# Patient Record
Sex: Female | Born: 1965 | Race: White | Hispanic: No | Marital: Married | Smoking: Former smoker
Health system: Southern US, Community
[De-identification: ages and names within clinical notes are randomized; demographics above are authoritative.]

## PROBLEM LIST (undated history)

## (undated) DIAGNOSIS — T7840XA Allergy, unspecified, initial encounter: Secondary | ICD-10-CM

## (undated) DIAGNOSIS — Z9109 Other allergy status, other than to drugs and biological substances: Secondary | ICD-10-CM

## (undated) DIAGNOSIS — N83209 Unspecified ovarian cyst, unspecified side: Secondary | ICD-10-CM

## (undated) DIAGNOSIS — Z8489 Family history of other specified conditions: Secondary | ICD-10-CM

## (undated) DIAGNOSIS — N946 Dysmenorrhea, unspecified: Secondary | ICD-10-CM

## (undated) DIAGNOSIS — D219 Benign neoplasm of connective and other soft tissue, unspecified: Secondary | ICD-10-CM

## (undated) HISTORY — PX: DILATION AND CURETTAGE OF UTERUS: SHX78

## (undated) HISTORY — DX: Allergy, unspecified, initial encounter: T78.40XA

## (undated) HISTORY — DX: Dysmenorrhea, unspecified: N94.6

## (undated) HISTORY — PX: TUBAL LIGATION: SHX77

## (undated) HISTORY — PX: ENDOMETRIAL ABLATION: SHX621

## (undated) HISTORY — DX: Benign neoplasm of connective and other soft tissue, unspecified: D21.9

## (undated) HISTORY — DX: Unspecified ovarian cyst, unspecified side: N83.209

---

## 2000-10-18 ENCOUNTER — Other Ambulatory Visit: Admission: RE | Admit: 2000-10-18 | Discharge: 2000-10-18 | Payer: Self-pay | Admitting: Obstetrics and Gynecology

## 2004-02-13 ENCOUNTER — Ambulatory Visit (HOSPITAL_COMMUNITY): Admission: RE | Admit: 2004-02-13 | Discharge: 2004-02-13 | Payer: Self-pay | Admitting: Obstetrics and Gynecology

## 2004-02-14 ENCOUNTER — Ambulatory Visit (HOSPITAL_COMMUNITY): Admission: RE | Admit: 2004-02-14 | Discharge: 2004-02-14 | Payer: Self-pay | Admitting: Obstetrics and Gynecology

## 2004-11-21 ENCOUNTER — Ambulatory Visit (HOSPITAL_COMMUNITY): Admission: RE | Admit: 2004-11-21 | Discharge: 2004-11-21 | Payer: Self-pay | Admitting: Family Medicine

## 2006-11-15 ENCOUNTER — Other Ambulatory Visit: Admission: RE | Admit: 2006-11-15 | Discharge: 2006-11-15 | Payer: Self-pay | Admitting: Internal Medicine

## 2006-11-16 ENCOUNTER — Ambulatory Visit (HOSPITAL_COMMUNITY): Admission: RE | Admit: 2006-11-16 | Discharge: 2006-11-16 | Payer: Self-pay | Admitting: Obstetrics & Gynecology

## 2007-04-21 ENCOUNTER — Ambulatory Visit (HOSPITAL_COMMUNITY): Admission: RE | Admit: 2007-04-21 | Discharge: 2007-04-21 | Payer: Self-pay | Admitting: Obstetrics and Gynecology

## 2010-02-13 ENCOUNTER — Other Ambulatory Visit: Payer: Self-pay | Admitting: Obstetrics & Gynecology

## 2010-02-13 ENCOUNTER — Other Ambulatory Visit: Payer: Self-pay | Admitting: Adult Health

## 2010-02-13 ENCOUNTER — Other Ambulatory Visit (HOSPITAL_COMMUNITY)
Admission: RE | Admit: 2010-02-13 | Discharge: 2010-02-13 | Disposition: A | Discharge status: Home / Self Care | Payer: 59 | Source: Ambulatory Visit | Attending: Obstetrics and Gynecology | Admitting: Obstetrics and Gynecology

## 2010-02-13 DIAGNOSIS — Z113 Encounter for screening for infections with a predominantly sexual mode of transmission: Secondary | ICD-10-CM | POA: Insufficient documentation

## 2010-02-13 DIAGNOSIS — N946 Dysmenorrhea, unspecified: Secondary | ICD-10-CM

## 2010-02-13 DIAGNOSIS — Z01419 Encounter for gynecological examination (general) (routine) without abnormal findings: Secondary | ICD-10-CM | POA: Insufficient documentation

## 2010-02-14 ENCOUNTER — Ambulatory Visit (HOSPITAL_COMMUNITY)
Admission: RE | Admit: 2010-02-14 | Discharge: 2010-02-14 | Disposition: A | Discharge status: Home / Self Care | Payer: 59 | Source: Ambulatory Visit | Attending: Obstetrics & Gynecology | Admitting: Obstetrics & Gynecology

## 2010-02-14 DIAGNOSIS — N946 Dysmenorrhea, unspecified: Secondary | ICD-10-CM | POA: Insufficient documentation

## 2010-02-14 DIAGNOSIS — D259 Leiomyoma of uterus, unspecified: Secondary | ICD-10-CM | POA: Insufficient documentation

## 2010-05-20 NOTE — H&P (Signed)
Tammy Garner, Tammy Garner          ACCOUNT NO.:  [id number]   MEDICAL RECORD NO.:  [id number]           PATIENT TYPE:  AMB   LOCATION:  DAY                           FACILITY:  [facility]   PHYSICIAN:  Tilda Burrow, M.D. DATE OF BIRTH:  1965-09-25   DATE OF ADMISSION:  DATE OF DISCHARGE:  LH                              HISTORY & PHYSICAL   ADMISSION DIAGNOSES:  1. Menorrhagia.  2. Dysmenorrhea.  3. Elective sterilization.   This 45 year old female, LMP was March 21, 2007, is admitted to [facility] for laparoscopic tubal sterilization by  Falope rings and hysteroscopy D&C endometrial ablation.   Tammy Garner has been seen in our office for problems of heavy periods since  her November annual exam.  She complained of dysmenorrhea.  She does not  have any pain except with the passage of the heavy clots.  She flows for  7 days or more at this time.  She is not anemic.  The procedure has been  reviewed at length with hysteroscopy D&C and endometrial ablation,  discussed with the patient including viewing of the Gynecare endometrial  ablation videotapes and use of the brochures for further clarification.  The patient understands the procedure to her satisfaction.  A 90%  success rate has been quoted to the patient.   PAST MEDICAL HISTORY:  Benign.   SURGICAL HISTORY:  Primary C-section for first baby.   REVIEW OF SYSTEMS:  Negative for bladder problems.  Normal bowel  function is reported.  There is no dyspareunia.  She denies reflux  symptoms, nausea, vomiting, or diarrhea.  She has no inner menstrual  bleeding.  There are no known allergies.  GENERAL PHYSICAL EXAM:  Reveals a healthy, pleasant Caucasian female  alert, oriented x3.  Pupils equal, round, and reactive.  Extraocular  movements intact.  NECK:  Supple.  Trachea midline.  CHEST:  Clear to auscultation.  ABDOMEN:  Moderate obesity without masses.  EXTERNAL GENITALIA:  Normal.  CERVIX:  Small,  smooth, nontender, recent Pap smear class I.   MEDICATIONS:  She takes garlic.  She takes colostrum.   HABITS:  Cigarettes five per day.  Alcohol rarely consumes single wine.  Recreational drugs denied.   IMPRESSION:  1. Menorrhagia.  2. Dysmenorrhea.  3. Elective sterilization.   PLAN:  Tubal ligation, Falope rings, followed by endometrial ablation on  April 21, 2007 at [facility].      Tilda Burrow, M.D.  Electronically Signed     JVF/MEDQ  D:  04/06/2007  T:  04/06/2007  Job:  [id number]   cc:   [facility]  Fax: [phone]

## 2010-05-20 NOTE — H&P (Signed)
NAMELETA, BUCKLIN          ACCOUNT NO.:  [id number]   MEDICAL RECORD NO.:  [id number]           PATIENT TYPE:  AMB   LOCATION:  DAY                           FACILITY:  [facility]   PHYSICIAN:  Tilda Burrow, M.D. DATE OF BIRTH:  07-21-1965   DATE OF ADMISSION:  DATE OF DISCHARGE:  LH                              HISTORY & PHYSICAL   ADMISSION DIAGNOSES:  1. Menorrhagia.  2. Dysmenorrhea.  3. Elective sterilization.   This 45 year old female, LMP was March 21, 2007, is admitted to [facility] for laparoscopic tubal sterilization by  Falope rings and hysteroscopy D&C endometrial ablation.   Rashay has been seen in our office for problems of heavy periods since  her November annual exam.  She complained of dysmenorrhea.  She does not  have any pain except with the passage of the heavy clots.  She flows for  7 days or more at this time.  She is not anemic.  The procedure has been  reviewed at length with hysteroscopy D&C and endometrial ablation,  discussed with the patient including viewing of the Gynecare endometrial  ablation videotapes and use of the brochures for further clarification.  The patient understands the procedure to her satisfaction.  A 90%  success rate has been quoted to the patient.   PAST MEDICAL HISTORY:  Benign.   SURGICAL HISTORY:  Primary C-section for first baby.   REVIEW OF SYSTEMS:  Negative for bladder problems.  Normal bowel  function is reported.  There is no dyspareunia.  She denies reflux  symptoms, nausea, vomiting, or diarrhea.  She has no inner menstrual  bleeding.  There are no known allergies.  GENERAL PHYSICAL EXAM:  Reveals a healthy, pleasant Caucasian female  alert, oriented x3.  Pupils equal, round, and reactive.  Extraocular  movements intact.  NECK:  Supple.  Trachea midline.  CHEST:  Clear to auscultation.  ABDOMEN:  Moderate obesity without masses.  EXTERNAL GENITALIA:  Normal.  CERVIX:  Small,  smooth, nontender, recent Pap smear class I.   MEDICATIONS:  She takes garlic.  She takes colostrum.   HABITS:  Cigarettes five per day.  Alcohol rarely consumes single wine.  Recreational drugs denied.   IMPRESSION:  1. Menorrhagia.  2. Dysmenorrhea.  3. Elective sterilization.   PLAN:  Tubal ligation, Falope rings, followed by endometrial ablation on  April 21, 2007 at [facility].      Tilda Burrow, M.D.  Electronically Signed     JVF/MEDQ  D:  04/06/2007  T:  04/06/2007  Job:  [id number]   cc:   [facility]  Fax: [phone]

## 2010-05-20 NOTE — Op Note (Signed)
Tammy Garner, Tammy Garner          ACCOUNT NO.:  [id number]   MEDICAL RECORD NO.:  [id number]          PATIENT TYPE:  AMB   LOCATION:  DAY                           FACILITY:  [facility]   PHYSICIAN:  Tilda Burrow, M.D. DATE OF BIRTH:  Aug 31, 1965   DATE OF PROCEDURE:  DATE OF DISCHARGE:                               OPERATIVE REPORT   PREOPERATIVE DIAGNOSIS:  1. Sterilization.  2. Menorrhagia, requesting endometrial ablation.   PROCEDURE:  Laparoscopic tubal sterilization with Falope ring,  hysteroscopy, D&C, and endometrial ablation.   SURGEON:  Tilda Burrow, M.D.   ASSISTANTBirdie Riddle, R.N.   ANESTHESIA:  Rosezetta Schlatter, CRNA.   COMPLICATIONS:  None.   FINDINGS:  Normal-appearing uterine contour, normal fallopian tubes,  thin endometrial cavity without visible tissue abnormalities, no  significant amount of tissue present as the patient is immediately after  her menses.   DETAILS OF PROCEDURE:  The patient was taken to the operating room,  prepped and draped for laparoscopic tubal sterilization with Hulka  tenaculum attached to the cervix for uterine manipulation.  An  infraumbilical vertical 1-cm skin incision was made as well as a  transverse suprapubic incision with Veress needle used through the  umbilicus to enter the peritoneal cavity where water droplet test was  used to confirm the intraperitoneal location and then the  pneumoperitoneum achieved to 3 L CO2 under 10 mm of mercury pressure.  Laparoscopic 5-mm trocar was introduced under direct visualization  without difficulties or complications.  Pelvis inspected; no evidence of  bleeding or injury to the internal organs suspected.  Suprapubic trocar,  8-mm diameter, was introduced under direct visualization without  difficulty.  Attention was directed to the left fallopian tube which was  identified, elevated, and a Falope ring applied to the midportion of the  left tube.  Photo documentation was  performed.  Similar procedure was  performed on the right side.  Both tubes were infiltrated with Marcaine  solution both under and above the Falope ring for pain control.  Deflation of the abdomen was performed and 120 mL saline placed in the  abdomen to help with the evacuation of carbon dioxide from the abdominal  cavity.  Steri-Strips were placed on the skin after subcuticular 4-0  Dexon used to close the skin incisions.   The hysteroscopy, D&C, endometrial ablation.  This procedure was  performed by positioning the surgeon at the end of the exam table with  the patient's legs in low lithotomy support position.  The cervix was  grasped with single-tooth tenaculum dilated to 25-French allowing  introduction of the rigid 30-degree hysteroscope into the uterine  cavity.  Photos of the lower uterine segment were taken as well as photo  documentation of the tubal ostia on the left and right.  The endometrial  cavity was very thin and denuded as the patient was immediately post  completion of menses.  So, a smooth sharp curettage obtained essentially  no tissue.  Therefore, there was no specimen.  Endometrial ablation  sequence was then conducted in a standard fashion using Gynecare  Thermachoice 3 endometrial ablation device at 87-degree  centigrade  thermal ablation.  Saline, 9 mL, was used and all 9 mL were recovered at  the end of the 8-minute sequence of ablation.  Paracervical block with  12 mL of Marcaine solution was performed.  The patient tolerated the  procedure well and went to the recovery room in good condition.      Tilda Burrow, M.D.  Electronically Signed     JVF/MEDQ  D:  04/21/2007  T:  04/21/2007  Job:  [id number]

## 2010-05-23 NOTE — Op Note (Signed)
NAMEMODESTY, RUDY          ACCOUNT NO.:  [id number]   MEDICAL RECORD NO.:  [id number]          PATIENT TYPE:  AMB   LOCATION:  DAY                           FACILITY:  [facility]   PHYSICIAN:  Tilda Burrow, M.D. DATE OF BIRTH:  1965/02/20   DATE OF PROCEDURE:  DATE OF DISCHARGE:                                 OPERATIVE REPORT   PREOPERATIVE DIAGNOSIS:  Missed abortion.   POSTOPERATIVE DIAGNOSIS:  Missed abortion.   PROCEDURE:  Suction dilation and curettage.   SURGEON:  Tilda Burrow, M.D.   ASSISTANT:  None.   FINDINGS:  Normal feeling internal uterine cavity.  Elongate, narrow uterus  13 cm on sounding. No identifiable fibroid deformity of the inside walls of  the uterine cavity.   DETAILS OF PROCEDURE:  The patient was taken to the operating room,  whereupon prepping and draping of the perineum was performed.  Blood type is  confirmed in the old records; it was A positive with the hemoglobin 13 and  hematocrit 41.  The patient had the cervix grasped with a single toothed  tenaculum dilated to 25 [place] with uterine sounding to 13 cm.  The cervix  was dilated allowing introduction of a curved 8 mm suction curette which was  able to extract appropriate amounts of tissue and blood in fragments under  suction technique.  A smooth sharp curettage in all quadrants confirmed  satisfactory uterine evacuation.  There was never any suspicion of  perforation.  The patient, then, was awakened, went to recovery room in good  condition with sponge and needle counts correct.      JVF/MEDQ  D:  02/14/2004  T:  02/14/2004  Job:  [id number]   cc:   Tilda Burrow, M.D.  [address] [place]  [place] [address]  Fax: [phone]

## 2010-09-30 LAB — CBC
HCT: 39.3
Hemoglobin: 13.9
MCHC: 35.4
MCV: 93.7
Platelets: 262
RBC: 4.2
RDW: 12.6
WBC: 10

## 2010-09-30 LAB — HCG, QUANTITATIVE, PREGNANCY: hCG, Beta Chain, Quant, S: <2

## 2010-12-09 ENCOUNTER — Other Ambulatory Visit: Payer: Self-pay

## 2010-12-09 ENCOUNTER — Emergency Department (HOSPITAL_COMMUNITY)
Admission: EM | Admit: 2010-12-09 | Discharge: 2010-12-09 | Disposition: A | Discharge status: Home / Self Care | Payer: 59 | Attending: Emergency Medicine | Admitting: Emergency Medicine

## 2010-12-09 ENCOUNTER — Emergency Department (HOSPITAL_COMMUNITY): Payer: 59

## 2010-12-09 ENCOUNTER — Encounter: Payer: Self-pay | Admitting: *Deleted

## 2010-12-09 DIAGNOSIS — R0609 Other forms of dyspnea: Secondary | ICD-10-CM | POA: Insufficient documentation

## 2010-12-09 DIAGNOSIS — R0602 Shortness of breath: Secondary | ICD-10-CM | POA: Insufficient documentation

## 2010-12-09 DIAGNOSIS — R0989 Other specified symptoms and signs involving the circulatory and respiratory systems: Secondary | ICD-10-CM | POA: Insufficient documentation

## 2010-12-09 DIAGNOSIS — R0600 Dyspnea, unspecified: Secondary | ICD-10-CM

## 2010-12-09 DIAGNOSIS — J45909 Unspecified asthma, uncomplicated: Secondary | ICD-10-CM | POA: Insufficient documentation

## 2010-12-09 DIAGNOSIS — R0789 Other chest pain: Secondary | ICD-10-CM | POA: Insufficient documentation

## 2010-12-09 DIAGNOSIS — F17200 Nicotine dependence, unspecified, uncomplicated: Secondary | ICD-10-CM | POA: Insufficient documentation

## 2010-12-09 HISTORY — DX: Other allergy status, other than to drugs and biological substances: Z91.09

## 2010-12-09 MED ORDER — PREDNISONE 20 MG PO TABS: 60.0000 mg | ORAL_TABLET | Freq: Every day | ORAL | Status: AC

## 2010-12-09 MED ORDER — PREDNISONE 20 MG PO TABS
60.0000 mg | ORAL_TABLET | Freq: Once | ORAL | Status: AC
  Administered 2010-12-09: 60 mg via ORAL
  Filled 2010-12-09: qty 3

## 2010-12-09 MED ORDER — ALBUTEROL SULFATE (5 MG/ML) 0.5% IN NEBU
5.0000 mg | INHALATION_SOLUTION | Freq: Once | RESPIRATORY_TRACT | Status: AC
  Administered 2010-12-09: 5 mg via RESPIRATORY_TRACT
  Filled 2010-12-09: qty 1

## 2010-12-09 NOTE — ED Notes (Signed)
Shortness of breath, congestion, ears popping.

## 2010-12-09 NOTE — ED Provider Notes (Signed)
History     CSN: [id number] Arrival date & time: 12/09/2010 12:24 AM   First MD Initiated Contact with Patient 12/09/10 0038      Chief Complaint  Patient presents with  . Shortness of Breath    (Consider location/radiation/quality/duration/timing/severity/associated sxs/prior treatment) HPI   Patient presents with 2 days of dyspnea and diffuse anteriorr hemothorax. She notes that in the days prior to development of her symptoms she was working in her yard, and in her usual state of health. Since onset her shortness of breath has been persistent, worsening over the past 12 hours. The pressure similarly has been constant, nonradiating, described as pressure like without pain. This sensation is not exertional, and there is no pleuritic pain at all. The patient's symptoms are not relieved with anything. They seem to be worsening on their own. No fevers, no vomiting, no diarrhea, mild cough. No sick contacts Past Medical History  Diagnosis Date  . Asthma   . Environmental allergies     Past Surgical History  Procedure Date  . Cesarean section   . Endometrial ablation   . Tubal ligation     History reviewed. No pertinent family history.  History  Substance Use Topics  . Smoking status: Current Everyday Smoker  . Smokeless tobacco: Not on file  . Alcohol Use: Yes    OB History    Grav Para Term Preterm Abortions TAB SAB Ect Mult Living                  Review of Systems  All other systems reviewed and are negative.    Allergies  Review of patient's allergies indicates no known allergies.  Home Medications   Current Outpatient Rx  Name Route Sig Dispense Refill  . ALBUTEROL SULFATE HFA 108 (90 BASE) MCG/ACT IN AERS Inhalation Inhale 2 puffs into the lungs every 6 (six) hours as needed.      Marland Kitchen CETIRIZINE HCL 10 MG PO TABS Oral Take 10 mg by mouth daily.      . GUAIFENESIN ER 600 MG PO TB12 Oral Take 1,200 mg by mouth 2 (two) times daily.      Marland Kitchen PROGESTERONE  MICRONIZED 100 MG PO CAPS Oral Take 100 mg by mouth daily. Unsure of doseage       BP 147/80  Pulse 89  Temp(Src) 98.5 F (36.9 C) (Oral)  Resp 20  SpO2 97%  LMP 12/02/2010  Physical Exam  Constitutional: She is oriented to person, place, and time. She appears well-developed and well-nourished.  HENT:  Head: Normocephalic and atraumatic.  Eyes: EOM are normal.  Cardiovascular: Normal rate and regular rhythm.   Pulmonary/Chest: Effort normal. She has wheezes in the right middle field.  Abdominal: She exhibits no distension.  Musculoskeletal: She exhibits no edema and no tenderness.  Neurological: She is alert and oriented to person, place, and time.  Skin: Skin is warm and dry.    ED Course  Procedures (including critical care time)  Labs Reviewed - No data to display No results found.   No diagnosis found.   X-ray does not demonstrate acute pathology   Date: 12/09/2010  Rate: 79  Rhythm: normal sinus rhythm  QRS Axis: normal  Intervals: normal  ST/T Wave abnormalities: normal  Conduction Disutrbances:none  Narrative Interpretation:   Old EKG Reviewed: none available   MDM  This 45 year old female presents with 2 days of dyspnea and mild chest pressure. He persistency of her pressure, the absence of cardiac history or risk  factors his reassuring. Similarly reassuring is the unremarkable ECG. The patient's presentation is more consistent with reactive airway disease, particularly with the wheezing that is audible on the patient's right lung field. The patient received an albuterol nebulizer, steroids and will be discharged with a short course of continued steroids and instructions to follow up with her primary care physician        Gerhard Munch, MD 12/09/10 [phone]

## 2010-12-09 NOTE — ED Notes (Signed)
Discharge instructions reviewed with pt, questions answered. Pt verbalized understanding.  

## 2013-09-22 ENCOUNTER — Ambulatory Visit (INDEPENDENT_AMBULATORY_CARE_PROVIDER_SITE_OTHER): Payer: 59 | Admitting: Family Medicine

## 2013-09-22 ENCOUNTER — Encounter: Payer: Self-pay | Admitting: Family Medicine

## 2013-09-22 VITALS — BP 120/88 | Temp 98.3°F | Ht 69.0 in | Wt 192.5 lb

## 2013-09-22 DIAGNOSIS — R109 Unspecified abdominal pain: Secondary | ICD-10-CM

## 2013-09-22 DIAGNOSIS — N3090 Cystitis, unspecified without hematuria: Secondary | ICD-10-CM

## 2013-09-22 LAB — POCT URINALYSIS DIPSTICK
Spec Grav, UA: <=1.005
pH, UA: 7

## 2013-09-22 MED ORDER — ETODOLAC 400 MG PO TABS: 400.0000 mg | ORAL_TABLET | Freq: Two times a day (BID) | ORAL | Status: DC

## 2013-09-22 NOTE — Progress Notes (Signed)
   Subjective:    Patient ID: Tammy Garner, female    DOB: Dec 08, 1965, 48 y.o.   MRN: [id number]  Urinary Tract Infection  This is a new problem. The current episode started 1 to 4 weeks ago. The problem occurs intermittently. The problem has been unchanged. The quality of the pain is described as aching. The pain is moderate. There has been no fever. Associated symptoms comments: Pressure, low abdominal pain, low back pain. She has tried NSAIDs for the symptoms. The treatment provided mild relief.   Patient states that she has no other concerns at this time.   For the past seven to ten d some disc and sharp at ties  Painful to lift leg  Worse woth lifting right leg  And tend eer a times  Hx of endomettr ablation years ago, btl hx alos   [employer] moved heavy things and felt upper arm sofeness  Last ten days  advil prn as needed for discomfort   Took advil early on, and none since     No dysuroia   No vag disch  incr acid and incr other fluids but didn't    Long time ago last uti  Yr plus  fam tree gyn       Results for orders placed in visit on 09/22/13  POCT URINALYSIS DIPSTICK      Result Value Ref Range   Color, UA       Clarity, UA       Glucose, UA       Bilirubin, UA       Ketones, UA       Spec Grav, UA <=1.005     Blood, UA       pH, UA 7.0     Protein, UA       Urobilinogen, UA       Nitrite, UA       Leukocytes, UA small (1+)         Review of Systems No fever no chills no vomiting no diarrhea ROS otherwise    Objective:   Physical Exam  Alert no apparent distress no CVA tenderness. Vital stable. Lungs clear. Heart regular in rhythm. Low abdomen tenderness deep palpation. Low back slightly tender to deep palpation.      Assessment & Plan:  Impression abdominal/lumbar strain discussed plan anti-inflammatory medicine. Local measures discussed. Expect gradual resolution. WSL

## 2013-10-04 ENCOUNTER — Encounter: Payer: Self-pay | Admitting: Adult Health

## 2013-10-04 ENCOUNTER — Other Ambulatory Visit (HOSPITAL_COMMUNITY)
Admission: RE | Admit: 2013-10-04 | Discharge: 2013-10-04 | Disposition: A | Discharge status: Home / Self Care | Payer: 59 | Source: Ambulatory Visit | Attending: Adult Health | Admitting: Adult Health

## 2013-10-04 ENCOUNTER — Ambulatory Visit (INDEPENDENT_AMBULATORY_CARE_PROVIDER_SITE_OTHER): Payer: 59 | Admitting: Adult Health

## 2013-10-04 VITALS — BP 136/90 | HR 76 | Ht 67.5 in | Wt 193.0 lb

## 2013-10-04 DIAGNOSIS — Z1151 Encounter for screening for human papillomavirus (HPV): Secondary | ICD-10-CM | POA: Diagnosis present

## 2013-10-04 DIAGNOSIS — Z1212 Encounter for screening for malignant neoplasm of rectum: Secondary | ICD-10-CM

## 2013-10-04 DIAGNOSIS — Z01419 Encounter for gynecological examination (general) (routine) without abnormal findings: Secondary | ICD-10-CM | POA: Insufficient documentation

## 2013-10-04 DIAGNOSIS — N946 Dysmenorrhea, unspecified: Secondary | ICD-10-CM | POA: Insufficient documentation

## 2013-10-04 HISTORY — DX: Dysmenorrhea, unspecified: N94.6

## 2013-10-04 LAB — HEMOCCULT GUIAC POC 1CARD (OFFICE): Fecal Occult Blood, POC: NEGATIVE

## 2013-10-04 NOTE — Patient Instructions (Signed)

## 2013-10-04 NOTE — Progress Notes (Signed)
Patient ID: Tammy Garner, female   DOB: [birth year], 48 y.o.   MRN: [id number] History of Present Illness: Tammy Garner is a 48 year old white female, married, in for a pap and physical.She is complaining of pain with her periods,taking 4 Advil every 5 hours, she is sp ablation and periods are light.She is having some hot flashes.Has strained muscles in low abdomen and back, has seen Dr Wolfgang Phoenix.   Current Medications, Allergies, Past Medical History, Past Surgical History, Family History and Social History were reviewed in [employer] record.     Review of Systems: Patient denies any headaches, blurred vision, shortness of breath, chest pain, abdominal pain, problems with bowel movements, urination, or intercourse. No joint or mood swings, see HPI for positives.    Physical Exam:BP 136/90  Pulse 76  Ht 5' 7.5" (1.715 m)  Wt 193 lb (87.544 kg)  BMI 29.76 kg/m2  LMP 09/29/2013 General:  Well developed, well nourished, no acute distress Skin:  Warm and dry Neck:  Midline trachea, normal thyroid Lungs; Clear to auscultation bilaterally Breast:  No dominant palpable mass, retraction, or nipple discharge Cardiovascular: Regular rate and rhythm Abdomen:  Soft, non tender, no hepatosplenomegaly Pelvic:  External genitalia is normal in appearance.  The vagina is normal in appearance, has dark brown discharge   The cervix is smooth.Pap with HPV performed.  Uterus is felt to be normal size, shape, and contour.  No   adnexal masses or tenderness noted. Rectal: Good sphincter tone, no polyps, interanl hemorrhoids felt.  Hemoccult negative. Extremities:  No swelling or varicosities noted Psych:  No mood changes, alert and cooperative,seems happy,daughter at [employer] Discussed getting [place] to assess uterus.  Impression: Yearly gyn exam Dysmenorhea    Plan: Physical in 1 year Mammogram now and yearly Return in 1 week for [place] and see me Labs with PCP Review  handout on dysmenorrhea

## 2013-10-05 LAB — CYTOLOGY - PAP

## 2013-10-11 ENCOUNTER — Ambulatory Visit: Payer: 59 | Admitting: Adult Health

## 2013-10-11 ENCOUNTER — Other Ambulatory Visit: Payer: 59

## 2013-10-16 ENCOUNTER — Ambulatory Visit (INDEPENDENT_AMBULATORY_CARE_PROVIDER_SITE_OTHER): Payer: 59

## 2013-10-16 ENCOUNTER — Ambulatory Visit (INDEPENDENT_AMBULATORY_CARE_PROVIDER_SITE_OTHER): Payer: 59 | Admitting: Adult Health

## 2013-10-16 ENCOUNTER — Encounter: Payer: Self-pay | Admitting: Adult Health

## 2013-10-16 ENCOUNTER — Other Ambulatory Visit: Payer: Self-pay | Admitting: Adult Health

## 2013-10-16 VITALS — BP 160/90 | Ht 67.0 in | Wt 193.0 lb

## 2013-10-16 DIAGNOSIS — D219 Benign neoplasm of connective and other soft tissue, unspecified: Secondary | ICD-10-CM

## 2013-10-16 DIAGNOSIS — D259 Leiomyoma of uterus, unspecified: Secondary | ICD-10-CM

## 2013-10-16 DIAGNOSIS — N946 Dysmenorrhea, unspecified: Secondary | ICD-10-CM

## 2013-10-16 DIAGNOSIS — N8320 Unspecified ovarian cysts: Secondary | ICD-10-CM

## 2013-10-16 DIAGNOSIS — N83209 Unspecified ovarian cyst, unspecified side: Secondary | ICD-10-CM

## 2013-10-16 DIAGNOSIS — N83201 Unspecified ovarian cyst, right side: Secondary | ICD-10-CM

## 2013-10-16 HISTORY — DX: Unspecified ovarian cyst, unspecified side: N83.209

## 2013-10-16 HISTORY — DX: Benign neoplasm of connective and other soft tissue, unspecified: D21.9

## 2013-10-16 MED ORDER — NAPROXEN SODIUM 550 MG PO TABS: 550.0000 mg | ORAL_TABLET | Freq: Two times a day (BID) | ORAL | Status: DC

## 2013-10-16 NOTE — Patient Instructions (Signed)
Uterine Fibroid A uterine fibroid is a growth (tumor) that occurs in your uterus. This type of tumor is not cancerous and does not spread out of the uterus. You can have one or many fibroids. Fibroids can vary in size, weight, and where they grow in the uterus. Some can become quite large. Most fibroids do not require medical treatment, but some can cause pain or heavy bleeding during and between periods. CAUSES  A fibroid is the result of a single uterine cell that keeps growing (unregulated), which is different than most cells in the human body. Most cells have a control mechanism that keeps them from reproducing without control.  SIGNS AND SYMPTOMS   Bleeding.  Pelvic pain and pressure.  Bladder problems due to the size of the fibroid.  Infertility and miscarriages depending on the size and location of the fibroid. DIAGNOSIS  Uterine fibroids are diagnosed through a physical exam. Your health care provider may feel the lumpy tumors during a pelvic exam. Ultrasonography may be done to get information regarding size, location, and number of tumors.  TREATMENT   Your health care provider may recommend watchful waiting. This involves getting the fibroid checked by your health care provider to see if it grows or shrinks.   Hormone treatment or an intrauterine device (IUD) may be prescribed.   Surgery may be needed to remove the fibroids (myomectomy) or the uterus (hysterectomy). This depends on your situation. When fibroids interfere with fertility and a woman wants to become pregnant, a health care provider may recommend having the fibroids removed.  [place] care depends on how you were treated. In general:   Keep all follow-up appointments with your health care provider.   Only take over-the-counter or prescription medicines as directed by your health care provider. If you were prescribed a hormone treatment, take the hormone medicines exactly as directed. Do not  take aspirin. It can cause bleeding.   Talk to your health care provider about taking iron pills.  If your periods are troublesome but not so heavy, lie down with your feet raised slightly above your heart. Place cold packs on your lower abdomen.   If your periods are heavy, write down the number of pads or tampons you use per month. Bring this information to your health care provider.   Include green vegetables in your diet.  SEEK IMMEDIATE MEDICAL CARE IF:  You have pelvic pain or cramps not controlled with medicines.   You have a sudden increase in pelvic pain.   You have an increase in bleeding between and during periods.   You have excessive periods and soak tampons or pads in a half hour or less.  You feel lightheaded or have fainting episodes. Document Released: 12/20/1999 Document Revised: 10/12/2012 Document Reviewed: 07/21/2012 [facility] Patient Information 2015 [place], [place]. This information is not intended to replace advice given to you by your health care provider. Make sure you discuss any questions you have with your health care provider. Try anaprox ds Follow up in 6 weeks

## 2013-10-16 NOTE — Progress Notes (Signed)
Subjective:     Patient ID: Tammy Garner, female   DOB: 20-Nov-1965, 48 y.o.   MRN: [id number]  HPI Tammy Garner is a 48 year old white female in for an [place] for dysmenorrhea, sp ablation.  Review of Systems See HPI Reviewed past medical,surgical, social and family history. Reviewed medications and allergies.     Objective:   Physical Exam BP 160/90  Ht 5\' 7"  (1.702 m)  Wt 193 lb (87.544 kg)  BMI 30.22 kg/m2  LMP 09/25/2015reviewed [place] with pt.   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   Assessment:     Dysmenorrhea Fibroids Ovarian cyst    Plan:     Rx Anaprox ds 1 bid prn #60 with 1 refill Increase fluids  Try diary with periods   Review handout on fibroid

## 2013-11-01 ENCOUNTER — Encounter: Payer: Self-pay | Admitting: Family Medicine

## 2013-11-01 ENCOUNTER — Ambulatory Visit (INDEPENDENT_AMBULATORY_CARE_PROVIDER_SITE_OTHER): Payer: 59 | Admitting: Family Medicine

## 2013-11-01 VITALS — BP 104/74 | Temp 98.7°F | Ht 69.0 in | Wt 190.4 lb

## 2013-11-01 DIAGNOSIS — H60392 Other infective otitis externa, left ear: Secondary | ICD-10-CM

## 2013-11-01 MED ORDER — OFLOXACIN 0.3 % OT SOLN: 5.0000 [drp] | Freq: Two times a day (BID) | OTIC | Status: DC

## 2013-11-01 NOTE — Progress Notes (Signed)
   Subjective:    Patient ID: Tammy Garner, female    DOB: [birth year], 48 y.o.   MRN: [id number]  Otalgia  There is pain in the left ear. This is a new problem. The current episode started in the past 7 days. The problem has been unchanged. There has been no fever. The pain is at a severity of 5/10. The pain is moderate. Associated symptoms include hearing loss. Treatments tried: coconut oil. The treatment provided no relief.   Patient states that she has no other concerns at this time.    Review of Systems  HENT: Positive for ear pain and hearing loss.        Objective:   Physical Exam  Left otitis externa noted right eardrum normal throat is normal neck supple lungs clear      Assessment & Plan:  Otitis externa drops recommended they were called in [facility] benign

## 2013-11-06 ENCOUNTER — Encounter: Payer: Self-pay | Admitting: Family Medicine

## 2013-11-27 ENCOUNTER — Ambulatory Visit (INDEPENDENT_AMBULATORY_CARE_PROVIDER_SITE_OTHER): Payer: 59 | Admitting: Adult Health

## 2013-11-27 ENCOUNTER — Encounter: Payer: Self-pay | Admitting: Adult Health

## 2013-11-27 VITALS — BP 148/90 | Ht 68.0 in | Wt 192.5 lb

## 2013-11-27 DIAGNOSIS — D259 Leiomyoma of uterus, unspecified: Secondary | ICD-10-CM

## 2013-11-27 DIAGNOSIS — N946 Dysmenorrhea, unspecified: Secondary | ICD-10-CM

## 2013-11-27 NOTE — Patient Instructions (Signed)
Pt to call if wants surgery

## 2013-11-27 NOTE — Progress Notes (Signed)
Subjective:     Patient ID: Tammy Garner, female   DOB: May 03, 1965, 48 y.o.   MRN: [id number]  HPI Tammy Garner is a 48 year old white female back in follow up of trying anaprox ds and is better.  Review of Systems See HPI Reviewed past medical,surgical, social and family history. Reviewed medications and allergies.     Objective:   Physical Exam BP 148/90 mmHg  Ht 5\' 8"  (1.727 m)  Wt 192 lb 8 oz (87.317 kg)  BMI 29.28 kg/m2  LMP 11/18/2013   Doing better with anaprox ds but still think about surgery.  Assessment:     Dysmenorrhea Fibroids     Plan:     Continue anaprox prn  Follow up prn

## 2014-03-27 ENCOUNTER — Encounter: Payer: Self-pay | Admitting: *Deleted

## 2014-03-28 ENCOUNTER — Encounter: Payer: Self-pay | Admitting: Family Medicine

## 2014-03-28 ENCOUNTER — Ambulatory Visit (INDEPENDENT_AMBULATORY_CARE_PROVIDER_SITE_OTHER): Payer: 59 | Admitting: Family Medicine

## 2014-03-28 VITALS — BP 164/106 | Temp 98.8°F | Ht 69.0 in | Wt 186.0 lb

## 2014-03-28 DIAGNOSIS — J019 Acute sinusitis, unspecified: Secondary | ICD-10-CM

## 2014-03-28 DIAGNOSIS — J309 Allergic rhinitis, unspecified: Secondary | ICD-10-CM | POA: Diagnosis not present

## 2014-03-28 DIAGNOSIS — B9689 Other specified bacterial agents as the cause of diseases classified elsewhere: Secondary | ICD-10-CM

## 2014-03-28 MED ORDER — FLUTICASONE PROPIONATE 50 MCG/ACT NA SUSP: 2.0000 | Freq: Every day | NASAL | Status: DC

## 2014-03-28 MED ORDER — LEVOFLOXACIN 500 MG PO TABS: 500.0000 mg | ORAL_TABLET | Freq: Every day | ORAL | Status: DC

## 2014-03-28 NOTE — Patient Instructions (Signed)
Stop decongestants  Recheck BP in 3 weeks to make

## 2014-03-28 NOTE — Progress Notes (Signed)
   Subjective:    Patient ID: Tammy Garner, female    DOB: 04/15/1965, 49 y.o.   MRN: [id number]  Cough This is a new problem. Episode onset: 8 days ago. Associated symptoms include nasal congestion, rhinorrhea, a sore throat and wheezing. Pertinent negatives include no chest pain, ear pain, fever or shortness of breath. Treatments tried: sudafed, mucinex, zyrtec.   PMH benign has a history of allergies.   Review of Systems  Constitutional: Negative for fever and activity change.  HENT: Positive for congestion, rhinorrhea and sore throat. Negative for ear pain.   Eyes: Negative for discharge.  Respiratory: Positive for cough and wheezing. Negative for shortness of breath.   Cardiovascular: Negative for chest pain.       Objective:   Physical Exam  Constitutional: She appears well-developed.  HENT:  Head: Normocephalic.  Nose: Nose normal.  Mouth/Throat: Oropharynx is clear and moist. No oropharyngeal exudate.  Neck: Neck supple.  Cardiovascular: Normal rate and normal heart sounds.   No murmur heard. Pulmonary/Chest: Effort normal and breath sounds normal. She has no wheezes.  Lymphadenopathy:    She has no cervical adenopathy.  Skin: Skin is warm and dry.  Nursing note and vitals reviewed.   Allergic rhinitis-OTC Allegra along with Flonase on a regular basis if ongoing troubles consider shot of steroids      Assessment & Plan:  Allergic rhinitis see above Sinusitis antibiotics prescribed Call back if needing a cystotomy shot of Depo-Medrol

## 2014-08-29 ENCOUNTER — Ambulatory Visit (INDEPENDENT_AMBULATORY_CARE_PROVIDER_SITE_OTHER): Payer: 59 | Admitting: Family Medicine

## 2014-08-29 ENCOUNTER — Encounter: Payer: Self-pay | Admitting: Family Medicine

## 2014-08-29 DIAGNOSIS — B349 Viral infection, unspecified: Secondary | ICD-10-CM | POA: Diagnosis not present

## 2014-08-29 DIAGNOSIS — J029 Acute pharyngitis, unspecified: Secondary | ICD-10-CM | POA: Diagnosis not present

## 2014-08-29 LAB — POCT RAPID STREP A (OFFICE): Rapid Strep A Screen: NEGATIVE

## 2014-08-29 NOTE — Progress Notes (Signed)
   Subjective:    Patient ID: Tammy Garner, female    DOB: [birth year], 49 y.o.   MRN: [id number]  HPI  Wrap around headache got a scratchy throat  White patches on the right side  No sig fever   Low grade,   Gr achey no dim   Diminished energy. Patient was concerned she may have strep.   Not aware of sickness  Headache frontal study in nature positive congestion thin discharge not gunky at all  Review of Systems No vomiting no diarrhea trace cough    Objective:   Physical Exam  Alert mild malaise vital stable HET moderate his no congestion no frontal tenderness pharynx couple small exudate patches right side otherwise normal neck supple. Lungs clear. Heart regular in rhythm.      Assessment & Plan:  Impression viral syndrome doubt bacterial illness discussed at length plan symptom care only. Warning signs discussed WSL

## 2014-08-30 LAB — STREP A DNA PROBE: Strep Gp A Direct, DNA Probe: NEGATIVE

## 2015-01-31 ENCOUNTER — Encounter: Payer: Self-pay | Admitting: Obstetrics and Gynecology

## 2015-01-31 ENCOUNTER — Ambulatory Visit (INDEPENDENT_AMBULATORY_CARE_PROVIDER_SITE_OTHER): Payer: 59 | Admitting: Obstetrics and Gynecology

## 2015-01-31 ENCOUNTER — Other Ambulatory Visit (HOSPITAL_COMMUNITY)
Admission: RE | Admit: 2015-01-31 | Discharge: 2015-01-31 | Disposition: A | Discharge status: Home / Self Care | Payer: 59 | Source: Ambulatory Visit | Attending: Obstetrics and Gynecology | Admitting: Obstetrics and Gynecology

## 2015-01-31 VITALS — BP 142/98 | Ht 68.0 in | Wt 187.0 lb

## 2015-01-31 DIAGNOSIS — N946 Dysmenorrhea, unspecified: Secondary | ICD-10-CM

## 2015-01-31 DIAGNOSIS — Z1151 Encounter for screening for human papillomavirus (HPV): Secondary | ICD-10-CM | POA: Diagnosis present

## 2015-01-31 DIAGNOSIS — Z124 Encounter for screening for malignant neoplasm of cervix: Secondary | ICD-10-CM

## 2015-01-31 DIAGNOSIS — Z139 Encounter for screening, unspecified: Secondary | ICD-10-CM

## 2015-01-31 DIAGNOSIS — Z01419 Encounter for gynecological examination (general) (routine) without abnormal findings: Secondary | ICD-10-CM | POA: Diagnosis present

## 2015-01-31 DIAGNOSIS — Z113 Encounter for screening for infections with a predominantly sexual mode of transmission: Secondary | ICD-10-CM | POA: Insufficient documentation

## 2015-01-31 NOTE — Progress Notes (Addendum)
Patient ID: Tammy Garner, female   DOB: [birth year], 50 y.o.   MRN: [id number]   [facility] Visit  Patient name: Tammy Garner MRN [id number]  Date of birth: 10-02-1965  CC & HPI:  Tammy Garner is a 50 y.o. female presenting today for review of complaints of horrible pain with periods x yrs, hx ablation, worked for a few yrs, period better and pain less. She reports menses now is NOT heavy, lasts 5-7 d, and rarely uses more than 1-2 pads/period with no Tampon supplements. Pt NOW with premenstrual ache x 3 days before menses. Requires Naproxen and Tylenol for pain. Also some midcycle ache, not requiring meds, just achy unilateral. Pt reports she is due for mammogram this year. No h/o abnormal pap smear.    ROS:  Married, x 20 yrs, no dyspareunia. Bladder: no probs, rare malodor in a.m. No SUI, No UI BM's: normal q 12 h.  G2P0011 cesarean Jinny Blossom) S/p ablation and tubal  Pertinent History Reviewed:   Reviewed: Significant for dysmenorrhea, fibroid, ovarian cyst, cesarean section, endometrial ablation, tubal ligation, D&C   Medical         Past Medical History  Diagnosis Date  . Asthma   . Environmental allergies   . Dysmenorrhea 10/04/2013  . Fibroid 10/16/2013  . Ovarian cyst 10/16/2013  . Allergy                               Surgical Hx:    Past Surgical History  Procedure Laterality Date  . Cesarean section    . Endometrial ablation    . Tubal ligation    . Dilation and curettage of uterus     Medications: Reviewed & Updated - see associated section                       Current outpatient prescriptions:  .  Ascorbic Acid (VITAMIN C) 1000 MG tablet, Take 1,000 mg by mouth daily., Disp: , Rfl:  .  cholecalciferol (VITAMIN D) 1000 UNITS tablet, Take 1,000 Units by mouth daily., Disp: , Rfl:  .  GARLIC PO, Take by mouth daily., Disp: , Rfl:  .  Multiple Vitamin (MULTIVITAMIN) tablet, Take 1 tablet by mouth daily., Disp: , Rfl:  .  naproxen  sodium (ANAPROX) 550 MG tablet, Take 550 mg by mouth 2 (two) times daily as needed., Disp: , Rfl:  .  albuterol (PROVENTIL HFA;VENTOLIN HFA) 108 (90 BASE) MCG/ACT inhaler, Inhale 2 puffs into the lungs every 6 (six) hours as needed. Reported on 01/31/2015, Disp: , Rfl:  .  cetirizine (ZYRTEC) 10 MG tablet, Take 10 mg by mouth daily. Reported on 01/31/2015, Disp: , Rfl:    Social History: Reviewed -  reports that she has been smoking Cigarettes.  She has a 15 pack-year smoking history. She has never used smokeless tobacco.  Objective Findings:  Vitals: Blood pressure 142/98, height 5\' 8"  (1.727 m), weight 187 lb (84.823 kg), last menstrual period 01/15/2015.  Physical Examination:  GENERAL: Well-developed, well-nourished female in no acute distress.  HEENT: Normocephalic, atraumatic. Sclerae anicteric.  NECK: Supple. Normal thyroid.  LUNGS: Clear to auscultation bilaterally.  HEART: Regular rate and rhythm. BREASTS: Symmetric in size. No masses, skin changes, nipple drainage, or lymphadenopathy. ABDOMEN: Soft, nontender, nondistended. No organomegaly. Irregular skin nevus at left abdominal wall well healed cesarean scar, but significant lower abd laxity. PELVIC: Normal external female  genitalia.  VAGINA: Vagina is pink and rugated.  Normal discharge.  CERVIX: normal in appearance; good support. Small. Anterior. Pap smear obtained. UTERUS: well supported. Anteverted  ADNEXA: No adnexal mass or tenderness.  RECTAL: normal rectal tone. No rectocele. Prolapsing internal hemorrhoid. Guaiac negative  EXTREMITIES: No cyanosis, clubbing, or edema, 2+ distal pulses.   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 Discussed risks of ovarian and cervical cancer with surgical options. At end of discussion, pt had  opportunity to ask questions and has no further questions at this time. Pt reports she would prefer to leave cervix and ovaries in place if surgery chosen.  Greater than 50% was spent in counseling and coordination of care with the patient. Total time greater than: 45 minutes   Assessment & Plan:   A:  1. Moderate to severe dysmenorrhea for years  -- s/p endometrial ablation 2. No dyspareunia  3. Pap smear and GC/CHL obtained 4. Irregular skin nevus left abdominal wall 5. Prolapsing internal hemorrhoid   P:  1. Order pelvic US 2. Discuss surgical options further after [place] 3. Fasting labs  4 consider removal of skin nevus at any future surgery  This chart was scribed for and reviewed for accuracy in the presence of Jonnie Kind, MD, on 01/31/2015 at 8:50 AM, by Hansel Feinstein, ED scribe.  I personally performed the services described in this documentation, which was SCRIBED in my presence. The recorded information has been reviewed and considered accurate. It has been edited as necessary during review. Jonnie Kind, MD

## 2015-01-31 NOTE — Progress Notes (Signed)
Patient ID: Tammy Garner, female   DOB: [birth year], 50 y.o.   MRN: [id number] Pt here today for pre op and to discuss a hysterectomy. Pt states that she has extreme pain with her cycles and her "uterus aches" all the time.

## 2015-02-01 LAB — CBC
HEMATOCRIT: 46.8 % — AB (ref 34.0–46.6)
Hemoglobin: 16.1 g/dL — ABNORMAL HIGH (ref 11.1–15.9)
MCH: 32.7 pg (ref 26.6–33.0)
MCHC: 34.4 g/dL (ref 31.5–35.7)
MCV: 95 fL (ref 79–97)
PLATELETS: 269 10*3/uL (ref 150–379)
RBC: 4.92 x10E6/uL (ref 3.77–5.28)
RDW: 12.8 % (ref 12.3–15.4)
WBC: 10.4 10*3/uL (ref 3.4–10.8)

## 2015-02-01 LAB — COMPREHENSIVE METABOLIC PANEL
A/G RATIO: 1.8 (ref 1.1–2.5)
ALK PHOS: 58 IU/L (ref 39–117)
ALT: 14 IU/L (ref 0–32)
AST: 23 IU/L (ref 0–40)
Albumin: 4.8 g/dL (ref 3.5–5.5)
BILIRUBIN TOTAL: 0.8 mg/dL (ref 0.0–1.2)
BUN/Creatinine Ratio: 12 (ref 9–23)
BUN: 8 mg/dL (ref 6–24)
CHLORIDE: 97 mmol/L (ref 96–106)
CO2: 22 mmol/L (ref 18–29)
Calcium: 10 mg/dL (ref 8.7–10.2)
Creatinine, Ser: 0.69 mg/dL (ref 0.57–1.00)
GFR calc Af Amer: 119 mL/min/{1.73_m2} (ref >59)
GFR, EST NON AFRICAN AMERICAN: 103 mL/min/{1.73_m2} (ref >59)
GLOBULIN, TOTAL: 2.6 g/dL (ref 1.5–4.5)
Glucose: 104 mg/dL — ABNORMAL HIGH (ref 65–99)
POTASSIUM: 4.4 mmol/L (ref 3.5–5.2)
SODIUM: 137 mmol/L (ref 134–144)
Total Protein: 7.4 g/dL (ref 6.0–8.5)

## 2015-02-01 LAB — TSH: TSH: 2.2 u[IU]/mL (ref 0.450–4.500)

## 2015-02-04 LAB — CYTOLOGY - PAP

## 2015-02-07 ENCOUNTER — Other Ambulatory Visit: Payer: 59

## 2015-02-07 ENCOUNTER — Other Ambulatory Visit: Payer: Self-pay | Admitting: Obstetrics and Gynecology

## 2015-02-07 DIAGNOSIS — N946 Dysmenorrhea, unspecified: Secondary | ICD-10-CM

## 2015-02-08 ENCOUNTER — Ambulatory Visit (INDEPENDENT_AMBULATORY_CARE_PROVIDER_SITE_OTHER): Payer: 59 | Admitting: Obstetrics and Gynecology

## 2015-02-08 ENCOUNTER — Encounter: Payer: Self-pay | Admitting: Obstetrics and Gynecology

## 2015-02-08 ENCOUNTER — Ambulatory Visit (INDEPENDENT_AMBULATORY_CARE_PROVIDER_SITE_OTHER): Payer: 59

## 2015-02-08 VITALS — BP 110/60 | Ht 68.0 in

## 2015-02-08 DIAGNOSIS — N946 Dysmenorrhea, unspecified: Secondary | ICD-10-CM

## 2015-02-08 DIAGNOSIS — D250 Submucous leiomyoma of uterus: Secondary | ICD-10-CM

## 2015-02-08 DIAGNOSIS — Q513 Bicornate uterus: Secondary | ICD-10-CM

## 2015-02-08 DIAGNOSIS — R102 Pelvic and perineal pain: Secondary | ICD-10-CM | POA: Diagnosis not present

## 2015-02-08 DIAGNOSIS — N854 Malposition of uterus: Secondary | ICD-10-CM | POA: Diagnosis not present

## 2015-02-08 DIAGNOSIS — D251 Intramural leiomyoma of uterus: Secondary | ICD-10-CM | POA: Diagnosis not present

## 2015-02-08 NOTE — Progress Notes (Signed)
Patient ID: Tammy Garner, female   DOB: 1965/12/18, 50 y.o.   MRN: [id number]    [facility] Visit  Patient name: Tammy Garner MRN [id number]  Date of birth: 01-12-1965  CC & HPI:  Tammy Garner is a 50 y.o. female presenting today for follow up of [place] results and discussion of surgical options. Pt reports she continues to have dysmenorrhea. She states she is still having periods.   ROS:  A complete 10 system review of systems was obtained and all systems are negative except as noted in the HPI and PMH.    Pertinent History Reviewed:   Reviewed: Significant for fibroid, dysmenorrhea, ovarian cyst, cesarean section, endometrial ablation, tubal ligation, D&C Medical         Past Medical History  Diagnosis Date  . Asthma   . Environmental allergies   . Dysmenorrhea 10/04/2013  . Fibroid 10/16/2013  . Ovarian cyst 10/16/2013  . Allergy                               Surgical Hx:    Past Surgical History  Procedure Laterality Date  . Cesarean section    . Endometrial ablation    . Tubal ligation    . Dilation and curettage of uterus     Medications: Reviewed & Updated - see associated section                       Current outpatient prescriptions:  .  albuterol (PROVENTIL HFA;VENTOLIN HFA) 108 (90 BASE) MCG/ACT inhaler, Inhale 2 puffs into the lungs every 6 (six) hours as needed. Reported on 01/31/2015, Disp: , Rfl:  .  Ascorbic Acid (VITAMIN C) 1000 MG tablet, Take 1,000 mg by mouth daily., Disp: , Rfl:  .  cetirizine (ZYRTEC) 10 MG tablet, Take 10 mg by mouth daily. Reported on 01/31/2015, Disp: , Rfl:  .  cholecalciferol (VITAMIN D) 1000 UNITS tablet, Take 1,000 Units by mouth daily., Disp: , Rfl:  .  GARLIC PO, Take by mouth daily., Disp: , Rfl:  .  Multiple Vitamin (MULTIVITAMIN) tablet, Take 1 tablet by mouth daily., Disp: , Rfl:  .  naproxen sodium (ANAPROX) 550 MG tablet, Take 550 mg by mouth 2 (two) times daily as needed., Disp: , Rfl:     Social History: Reviewed -  reports that she has been smoking Cigarettes.  She has a 15 pack-year smoking history. She has never used smokeless tobacco.  Objective Findings:  Vitals: Blood pressure 110/60, height 5\' 8"  (1.727 m), last menstrual period 02/06/2015.  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 At end of discussion, pt had opportunity to ask questions and has no further questions at this time.   Greater than 50% was spent in counseling and coordination of care with the patient. Total time greater than: 25 minutes   Assessment & Plan:   A:  1. Moderate to severe dysmenorrhea for years s/p endometrial ablation; No dyspareunia  2. Irregular skin nevus left abdominal wall 3. Prolapsing internal hemorrhoid 4. Pap on 01/31/15 negative; CBC on 01/31/15 showed Hct 46.8, Hgb 16.1 5. Pelvic US on 02/08/15 showed multiple fibroids; post submucosal fibroid with  calcifications, intramural fundal fibroid   P:  1. Will schedule laparoscopic supracervical hysterectomy with bilateral salpingectomy, removal of skin nevus left abdominal wall within the next 2-3 weeks 2. Follow up as indicated     By signing my name below, I, Hansel Feinstein, attest that this documentation has been prepared under the direction and in the presence of Jonnie Kind, MD. Electronically Signed: Hansel Feinstein, ED Scribe. 02/08/2015. 10:06 AM.  .I personally performed the services described in this documentation, which was SCRIBED in my presence. The recorded information has been reviewed and considered accurate. It has been edited as necessary during review. Jonnie Kind, MD

## 2015-02-08 NOTE — Progress Notes (Signed)
Patient ID: Tammy Garner, female   DOB: 12/19/1965, 50 y.o.   MRN: [id number] Pt here today for follow up visit and results.

## 2015-02-08 NOTE — Progress Notes (Signed)
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

## 2015-02-08 NOTE — Patient Instructions (Signed)
Supracervical Hysterectomy A supracervical hysterectomy is surgery to remove the top part of the uterus, but not the cervix. You will no longer have menstrual periods or be able to get pregnant after this surgery. The fallopian tubes and ovaries may also be removed (bilateral salpingo-oophorectomy) during this surgery. This surgery is usually performed using a minimally invasive technique called laparoscopy. This technique allows the surgery to be done through small incisions. The minimally invasive technique provides benefits such as less pain, less risk of infection, and shorter recovery time. LET [facility] CARE PROVIDER KNOW ABOUT:  Any allergies you have.  All medicines you are taking, including vitamins, herbs, eye drops, creams, and over-the-counter medicines.  Previous problems you or members of your family have had with the use of anesthetics.  Any blood disorders you have.  Previous surgeries you have had.  Medical conditions you have. RISKS AND COMPLICATIONS  Generally, this is a safe procedure. However, as with any procedure, complications can occur. Possible complications include:  Bleeding.  Blood clots in the legs or lung.  Infection.  Injury to surrounding organs.  Problems related to anesthesia.  Conversion to an open abdominal surgery.  Additional surgery later to remove the cervix if you have problems with the cervix. BEFORE THE PROCEDURE  Ask your health care provider about changing or stopping your regular medicines.  Do not take aspirin or blood thinners (anticoagulants) for 1 week before the surgery, or as directed by your health care provider.  Do not eat or drink anything for 8 hours before the surgery, or as directed by your health care provider.  Quit smoking if you smoke.  Arrange for a ride home after surgery and for someone to help you at home during recovery. PROCEDURE   You will be given an antibiotic medicine.  An IV tube will be placed  in one of your veins. You will be given medicine to make you sleep (general anesthetic).  A gas (carbon dioxide) will be used to inflate your abdomen. This will allow your surgeon to look inside your abdomen, perform your surgery, and treat any other problems found if necessary.  Three or four small incisions will be made in your abdomen. One of these incisions will be made in the area of your belly button (navel). A thin, flexible tube with a tiny camera and light on the end of it (laparoscope) will be inserted into the incision. The camera on the laparoscope sends a picture to a TV screen in the operating room. This gives your surgeon a good view inside the abdomen.  Other surgical instruments will be inserted through the other incisions.  The uterus will be cut into small pieces and removed through the small incisions.  Your incisions will be closed. AFTER THE PROCEDURE   You will be taken to a recovery area where your progress will be monitored until you are awake, stable, and taking fluids well. If there are no other problems, you will then be moved to a regular hospital room, or you will be allowed to go home.  You will likely have minimal discomfort after the surgery because the incisions are so small with the laparoscopic technique.  You will be given pain medicine while you are in the hospital and for when you go home.  If a bilateral salpingo-oophorectomy was performed before menopause, you will go through a sudden (abrupt) menopause. This can be helped with hormone medicines.   This information is not intended to replace advice given to  you by your health care provider. Make sure you discuss any questions you have with your health care provider.   Document Released: 06/10/2007 Document Revised: 10/12/2012 Document Reviewed: 06/24/2012 Elsevier Interactive Patient Education [employer].

## 2015-02-15 ENCOUNTER — Telehealth: Payer: Self-pay | Admitting: *Deleted

## 2015-02-15 NOTE — Telephone Encounter (Signed)
Pt requesting a work note for her husband Nolia Fitzpatrick due to her having surgery on 03/05/2015 through 03/11/2015 and needed assistance during that time. Note completed and left at front desk for pick up.

## 2015-02-22 ENCOUNTER — Ambulatory Visit (INDEPENDENT_AMBULATORY_CARE_PROVIDER_SITE_OTHER): Payer: 59 | Admitting: Family Medicine

## 2015-02-22 ENCOUNTER — Encounter: Payer: Self-pay | Admitting: Family Medicine

## 2015-02-22 VITALS — BP 140/80 | Temp 99.6°F | Ht 69.0 in | Wt 184.5 lb

## 2015-02-22 DIAGNOSIS — J019 Acute sinusitis, unspecified: Secondary | ICD-10-CM

## 2015-02-22 DIAGNOSIS — J111 Influenza due to unidentified influenza virus with other respiratory manifestations: Secondary | ICD-10-CM | POA: Diagnosis not present

## 2015-02-22 DIAGNOSIS — B9689 Other specified bacterial agents as the cause of diseases classified elsewhere: Secondary | ICD-10-CM

## 2015-02-22 DIAGNOSIS — B349 Viral infection, unspecified: Secondary | ICD-10-CM

## 2015-02-22 MED ORDER — CEFDINIR 300 MG PO CAPS: 300.0000 mg | ORAL_CAPSULE | Freq: Two times a day (BID) | ORAL | Status: DC

## 2015-02-22 MED ORDER — OSELTAMIVIR PHOSPHATE 75 MG PO CAPS: 75.0000 mg | ORAL_CAPSULE | Freq: Two times a day (BID) | ORAL | Status: DC

## 2015-02-22 NOTE — Progress Notes (Signed)
   Subjective:    Patient ID: Tammy Garner, female    DOB: [birth year], 50 y.o.   MRN: [id number]  Fever  This is a new problem. The current episode started in the past 7 days. The problem has been unchanged. The maximum temperature noted was 101 to 101.9 F. Associated symptoms include coughing, headaches, a sore throat and wheezing. Treatments tried: Nyquil. The treatment provided no relief.    Frontal headache, also in the sinuses,  Dim energy  Zapped energy    some back a  Appetite ok   Used albuterol today felt tight   antiinfliam irrit stomach   Review of Systems  Constitutional: Positive for fever.  HENT: Positive for sore throat.   Respiratory: Positive for cough and wheezing.   Neurological: Positive for headaches.       Objective:   Physical Exam  Alert, mild malaise. Hyon #1 good Vitals stable. frontal/ maxillary tenderness evident positive nasal congestion. pharynx normal neck supple  lungs clear/no crackles or wheezes. heart regular in rhythm       Assessment & Plan:  Impression rhinosinusitis likely post viral, discussed with patient. plan antibiotics prescribed. Questions answered. Symptomatic care discussed. warning signs discussed. WSL Acute flu also, pending surg, will rx both flu and secondary sinusitis/btronchitis to optimize healing time

## 2015-02-27 NOTE — Patient Instructions (Signed)
Tammy Garner PAYNE  02/27/2015     @PREFPERIOPPHARMACY @   Your procedure is scheduled on 03/05/2015  Report to [facility] at 6:15 A.M.  Call this number if you have problems the morning of surgery:  [phone]   Remember:  Do not eat food or drink liquids after midnight.  Take these medicines the morning of surgery with A SIP OF WATER Albuterol inhaler (also bring to hospital with you), Zyrtec   Do not wear jewelry, make-up or nail polish.  Do not wear lotions, powders, or perfumes.  You may wear deodorant.  Do not shave 48 hours prior to surgery.  Men may shave face and neck.  Do not bring valuables to the hospital.  [facility] is not responsible for any belongings or valuables.  Contacts, dentures or bridgework may not be worn into surgery.  Leave your suitcase in the car.  After surgery it may be brought to your room.  For patients admitted to the hospital, discharge time will be determined by your treatment team.  Patients discharged the day of surgery will not be allowed to drive home.    Please read over the following fact sheets that you were given. Surgical Site Infection Prevention and Anesthesia Post-op Instructions     PATIENT INSTRUCTIONS POST-ANESTHESIA  IMMEDIATELY FOLLOWING SURGERY:  Do not drive or operate machinery for the first twenty four hours after surgery.  Do not make any important decisions for twenty four hours after surgery or while taking narcotic pain medications or sedatives.  If you develop intractable nausea and vomiting or a severe headache please notify your doctor immediately.  FOLLOW-UP:  Please make an appointment with your surgeon as instructed. You do not need to follow up with anesthesia unless specifically instructed to do so.  WOUND CARE INSTRUCTIONS (if applicable):  Keep a dry clean dressing on the anesthesia/puncture wound site if there is drainage.  Once the wound has quit draining you may leave it open to air.  Generally you  should leave the bandage intact for twenty four hours unless there is drainage.  If the epidural site drains for more than 36-48 hours please call the anesthesia department.  QUESTIONS?:  Please feel free to call your physician or the hospital operator if you have any questions, and they will be happy to assist you.      Salpingectomy Salpingectomy, also called tubectomy, is the surgical removal of one of the fallopian tubes. The fallopian tubes are tubes that are connected to the uterus. These tubes transport the egg from the ovary to the uterus. A salpingectomy may be done for various reasons, including:   A tubal (ectopic) pregnancy. This is especially true if the tube ruptures.  An infected fallopian tube.  The need to remove the fallopian tube when removing an ovary with a cyst or tumor.  The need to remove the fallopian tube when removing the uterus.  Cancer of the fallopian tube or nearby organs. Removing one fallopian tube does not prevent you from becoming pregnant. It also does not cause problems with your menstrual periods.  LET [facility] CARE PROVIDER KNOW ABOUT:  Any allergies you have.  All medicines you are taking, including vitamins, herbs, eye drops, creams, and over-the-counter medicines.  Previous problems you or members of your family have had with the use of anesthetics.  Any blood disorders you have.  Previous surgeries you have had.  Medical conditions you have. RISKS AND COMPLICATIONS  Generally, this is a safe  procedure. However, as with any procedure, complications can occur. Possible complications include:  Injury to surrounding organs.  Bleeding.  Infection.  Problems related to anesthesia. BEFORE THE PROCEDURE  Ask your health care provider about changing or stopping your regular medicines. You may need to stop taking certain medicines, such as aspirin or blood thinners, at least 1 week before the surgery.  Do not eat or drink anything for  at least 8 hours before the surgery.  If you smoke, do not smoke for at least 2 weeks before the surgery.  Make plans to have someone drive you home after the procedure or after your hospital stay. Also arrange for someone to help you with activities during recovery. PROCEDURE   You will be given medicine to help you relax before the procedure (sedative). You will then be given medicine to make you sleep through the procedure (general anesthetic). These medicines will be given through an IV access tube that is put into one of your veins.  Once you are asleep, your lower abdomen will be shaved and cleaned. A thin, flexible tube (catheter) will be placed in your bladder.  The surgeon may use a laparoscopic, robotic, or open technique for this surgery:  In the laparoscopic technique, the surgery is done through two small cuts (incisions) in the abdomen. A thin, lighted tube with a tiny camera on the end (laparoscope) is inserted into one of the incisions. The tools needed for the procedure are put through the other incision.  A robotic technique may be chosen to perform complex surgery in a small space. In the robotic technique, small incisions will be made. A camera and surgical instruments are passed through the incisions. Surgical instruments will be controlled with the help of a robotic arm.  In the open technique, the surgery is done through one large incision in the abdomen.  Using any of these techniques, the surgeon removes the fallopian tube from where it attaches to the uterus. The blood vessels will be clamped and tied.  The surgeon then uses staples or stitches to close the incision or incisions. AFTER THE PROCEDURE   You will be taken to a recovery area where your progress will be monitored for 1-3 hours.  If the laparoscopic technique was used, you may be allowed to go home after several hours. You may have some shoulder pain after the laparoscopic procedure. This is normal and  usually goes away in a day or two.  If the open technique was used, you will be admitted to the hospital for a couple of days.  You will be given pain medicine if needed.  The IV access tube and catheter will be removed before you are discharged.   This information is not intended to replace advice given to you by your health care provider. Make sure you discuss any questions you have with your health care provider.   Document Released: 05/10/2008 Document Revised: 01/12/2014 Document Reviewed: 06/15/2012 Elsevier Interactive Patient Education [employer]. Hysteroscopy Hysteroscopy is a procedure used for looking inside the womb (uterus). It may be done for various reasons, including:  To evaluate abnormal bleeding, fibroid (benign, noncancerous) tumors, polyps, scar tissue (adhesions), and possibly cancer of the uterus.  To look for lumps (tumors) and other uterine growths.  To look for causes of why a woman cannot get pregnant (infertility), causes of recurrent loss of pregnancy (miscarriages), or a lost intrauterine device (IUD).  To perform a sterilization by blocking the fallopian tubes from inside  the uterus. In this procedure, a thin, flexible tube with a tiny light and camera on the end of it (hysteroscope) is used to look inside the uterus. A hysteroscopy should be done right after a menstrual period to be sure you are not pregnant. LET [facility] CARE PROVIDER KNOW ABOUT:   Any allergies you have.  All medicines you are taking, including vitamins, herbs, eye drops, creams, and over-the-counter medicines.  Previous problems you or members of your family have had with the use of anesthetics.  Any blood disorders you have.  Previous surgeries you have had.  Medical conditions you have. RISKS AND COMPLICATIONS  Generally, this is a safe procedure. However, as with any procedure, complications can occur. Possible complications include:  Putting a hole in the  uterus.  Excessive bleeding.  Infection.  Damage to the cervix.  Injury to other organs.  Allergic reaction to medicines.  Too much fluid used in the uterus for the procedure. BEFORE THE PROCEDURE   Ask your health care provider about changing or stopping any regular medicines.  Do not take aspirin or blood thinners for 1 week before the procedure, or as directed by your health care provider. These can cause bleeding.  If you smoke, do not smoke for 2 weeks before the procedure.  In some cases, a medicine is placed in the cervix the day before the procedure. This medicine makes the cervix have a larger opening (dilate). This makes it easier for the instrument to be inserted into the uterus during the procedure.  Do not eat or drink anything for at least 8 hours before the surgery.  Arrange for someone to take you home after the procedure. PROCEDURE   You may be given a medicine to relax you (sedative). You may also be given one of the following:  A medicine that numbs the area around the cervix (local anesthetic).  A medicine that makes you sleep through the procedure (general anesthetic).  The hysteroscope is inserted through the vagina into the uterus. The camera on the hysteroscope sends a picture to a TV screen. This gives the surgeon a good view inside the uterus.  During the procedure, air or a liquid is put into the uterus, which allows the surgeon to see better.  Sometimes, tissue is gently scraped from inside the uterus. These tissue samples are sent to a lab for testing. AFTER THE PROCEDURE   If you had a general anesthetic, you may be groggy for a couple hours after the procedure.  If you had a local anesthetic, you will be able to go home as soon as you are stable and feel ready.  You may have some cramping. This normally lasts for a couple days.  You may have bleeding, which varies from light spotting for a few days to menstrual-like bleeding for 3-7 days.  This is normal.  If your test results are not back during the visit, make an appointment with your health care provider to find out the results.   This information is not intended to replace advice given to you by your health care provider. Make sure you discuss any questions you have with your health care provider.   Document Released: 03/30/2000 Document Revised: 10/12/2012 Document Reviewed: 07/21/2012 Elsevier Interactive Patient Education [employer].

## 2015-02-28 ENCOUNTER — Encounter (HOSPITAL_COMMUNITY)
Admission: RE | Admit: 2015-02-28 | Discharge: 2015-02-28 | Disposition: A | Discharge status: Home / Self Care | Payer: 59 | Source: Ambulatory Visit | Attending: Obstetrics and Gynecology | Admitting: Obstetrics and Gynecology

## 2015-02-28 ENCOUNTER — Other Ambulatory Visit: Payer: Self-pay | Admitting: Obstetrics and Gynecology

## 2015-02-28 ENCOUNTER — Encounter (HOSPITAL_COMMUNITY): Payer: Self-pay

## 2015-02-28 DIAGNOSIS — Z01812 Encounter for preprocedural laboratory examination: Secondary | ICD-10-CM | POA: Insufficient documentation

## 2015-02-28 HISTORY — DX: Family history of other specified conditions: Z84.89

## 2015-02-28 LAB — URINE MICROSCOPIC-ADD ON

## 2015-02-28 LAB — CBC
HCT: 43.8 % (ref 36.0–46.0)
Hemoglobin: 15.4 g/dL — ABNORMAL HIGH (ref 12.0–15.0)
MCH: 32.8 pg (ref 26.0–34.0)
MCHC: 35.2 g/dL (ref 30.0–36.0)
MCV: 93.4 fL (ref 78.0–100.0)
PLATELETS: 225 10*3/uL (ref 150–400)
RBC: 4.69 MIL/uL (ref 3.87–5.11)
RDW: 12.3 % (ref 11.5–15.5)
WBC: 11.7 10*3/uL — AB (ref 4.0–10.5)

## 2015-02-28 LAB — URINALYSIS, ROUTINE W REFLEX MICROSCOPIC
BILIRUBIN URINE: NEGATIVE
Glucose, UA: NEGATIVE mg/dL
KETONES UR: NEGATIVE mg/dL
Leukocytes, UA: NEGATIVE
NITRITE: NEGATIVE
Protein, ur: NEGATIVE mg/dL
Specific Gravity, Urine: <1.005 — ABNORMAL LOW (ref 1.005–1.030)
pH: 5.5 (ref 5.0–8.0)

## 2015-02-28 LAB — HCG, SERUM, QUALITATIVE: Preg, Serum: NEGATIVE

## 2015-02-28 LAB — TYPE AND SCREEN
ABO/RH(D): A POS
ANTIBODY SCREEN: NEGATIVE

## 2015-02-28 NOTE — Pre-Procedure Instructions (Signed)
Patient given information to sign up for my chart at home. 

## 2015-03-04 NOTE — H&P (Addendum)
Patient ID: MIKENZI SHAWVER, female DOB: [birth year], 50 y.o. MRN: [id number]   [facility] Visit  Patient name: Tammy Garner [id number] Date of birth: 02/02/1965  CC & HPI:  Tammy Garner is a 50 y.o. female presenting today for abdominal supracervical hysterectomy, bilateral salpingectomy, and removal of a minor abdominal wall skin nevus that has irregular borders.. Pt reports she continues to have dysmenorrhea. She states she is still having periods.  She is  Presenting for surgery today for  complaints of horrible pain with periods x yrs, hx ablation, worked for a few yrs, period better and pain less. She reports menses now is NOT heavy, lasts 5-7 d, and rarely uses more than 1-2 pads/period with no Tampon supplements. Pt NOW with premenstrual ache x 3 days before menses. Requires Naproxen and Tylenol for pain. Also some midcycle ache, not requiring meds, just achy unilateral. Pt reports she is due for mammogram this year. No h/o abnormal pap smear.Recent pap normal Jan 2017 Endometrial thickness 3.3 mm , symmetric on recent u/s  ROS:  A complete 10 system review of systems was obtained and all systems are negative except as noted in the HPI and PMH.   Pertinent History Reviewed:  Reviewed: Significant for fibroid, dysmenorrhea, ovarian cyst, cesarean section, endometrial ablation, tubal ligation, D&C Medical  Past Medical History  Diagnosis Date  . Asthma   . Environmental allergies   . Dysmenorrhea 10/04/2013  . Fibroid 10/16/2013  . Ovarian cyst 10/16/2013  . Allergy     Surgical Hx:  Past Surgical History  Procedure Laterality Date  . Cesarean section    . Endometrial ablation    . Tubal ligation    . Dilation and curettage of uterus     Medications: Reviewed & Updated - see associated section   Current outpatient  prescriptions:  . albuterol (PROVENTIL HFA;VENTOLIN HFA) 108 (90 BASE) MCG/ACT inhaler, Inhale 2 puffs into the lungs every 6 (six) hours as needed. Reported on 01/31/2015, Disp: , Rfl:  . Ascorbic Acid (VITAMIN C) 1000 MG tablet, Take 1,000 mg by mouth daily., Disp: , Rfl:  . cetirizine (ZYRTEC) 10 MG tablet, Take 10 mg by mouth daily. Reported on 01/31/2015, Disp: , Rfl:  . cholecalciferol (VITAMIN D) 1000 UNITS tablet, Take 1,000 Units by mouth daily., Disp: , Rfl:  . GARLIC PO, Take by mouth daily., Disp: , Rfl:  . Multiple Vitamin (MULTIVITAMIN) tablet, Take 1 tablet by mouth daily., Disp: , Rfl:  . naproxen sodium (ANAPROX) 550 MG tablet, Take 550 mg by mouth 2 (two) times daily as needed., Disp: , Rfl:    Social History: Reviewed -  reports that she has been smoking Cigarettes. She has a 15 pack-year smoking history. She has never used smokeless tobacco.  Objective Findings:  Vitals: Blood pressure 110/60, height 5\' 8"  (1.727 m), last menstrual period 02/06/2015.   Mental status - alert, oriented to person, place, and time  Physical Examination:  GENERAL: Well-developed, well-nourished female in no acute distress.  HEENT: Normocephalic, atraumatic. Sclerae anicteric.  NECK: Supple. Normal thyroid.  LUNGS: Clear to auscultation bilaterally.  HEART: Regular rate and rhythm. BREASTS: Symmetric in size. No masses, skin changes, nipple drainage, or lymphadenopathy. ABDOMEN: Soft, nontender, nondistended. No organomegaly. Irregular skin nevus at left abdominal wall well healed cesarean scar, but significant lower abd laxity. PELVIC: Normal external female genitalia.  VAGINA: Vagina is pink and rugated. Normal discharge.  CERVIX: normal in appearance; good support. Small. Anterior. Pap smear obtained.  UTERUS: well supported. Anteverted  ADNEXA: No adnexal mass or tenderness.  RECTAL: normal rectal tone. No rectocele. Prolapsing internal hemorrhoid. Guaiac  negative  EXTREMITIES: No cyanosis, clubbing, or edema, 2+ distal pulses.   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 At end of discussion, pt had opportunity to ask questions and has no further questions at this time.   Greater than 50% was spent in counseling and coordination of care with the patient. Total time greater than: 25 minutes   Assessment & Plan:   A:  1. Moderate to severe dysmenorrhea for years s/p endometrial ablation; No dyspareunia  2. Irregular skin nevus left abdominal wall 4. Pap on 01/31/15 negative; CBC on 01/31/15 showed Hct 46.8, Hgb 16.1 5. Pelvic US on 02/08/15 showed multiple fibroids; post submucosal fibroid with calcifications, intramural fundal fibroid   P:  1. scheduled laparoscopic supracervical hysterectomy with bilateral salpingectomy, removal of skin nevus left abdominal wall .    By signing my name below, I, Hansel Feinstein, attest that this documentation has been prepared under the direction and in the presence of Jonnie Kind, MD. Electronically Signed: Hansel Feinstein, ED Scribe. 02/08/2015. 10:06 AM.  .I personally performed the services described in this documentation, which was SCRIBED in my presence. The recorded information has been reviewed and considered accurate. It has been edited as necessary during review. Jonnie Kind, MD

## 2015-03-05 ENCOUNTER — Observation Stay (HOSPITAL_COMMUNITY)
Admission: RE | Admit: 2015-03-05 | Discharge: 2015-03-06 | Disposition: A | Discharge status: Home / Self Care | Payer: 59 | Source: Ambulatory Visit | Attending: Obstetrics and Gynecology | Admitting: Obstetrics and Gynecology

## 2015-03-05 ENCOUNTER — Ambulatory Visit (HOSPITAL_COMMUNITY): Payer: 59 | Admitting: Anesthesiology

## 2015-03-05 ENCOUNTER — Encounter (HOSPITAL_COMMUNITY): Payer: Self-pay | Admitting: *Deleted

## 2015-03-05 ENCOUNTER — Encounter (HOSPITAL_COMMUNITY)
Admission: RE | Disposition: A | Discharge status: Home / Self Care | Payer: Self-pay | Source: Ambulatory Visit | Attending: Obstetrics and Gynecology

## 2015-03-05 DIAGNOSIS — Z9851 Tubal ligation status: Secondary | ICD-10-CM | POA: Insufficient documentation

## 2015-03-05 DIAGNOSIS — F17200 Nicotine dependence, unspecified, uncomplicated: Secondary | ICD-10-CM | POA: Insufficient documentation

## 2015-03-05 DIAGNOSIS — N838 Other noninflammatory disorders of ovary, fallopian tube and broad ligament: Secondary | ICD-10-CM | POA: Insufficient documentation

## 2015-03-05 DIAGNOSIS — Z79899 Other long term (current) drug therapy: Secondary | ICD-10-CM | POA: Diagnosis not present

## 2015-03-05 DIAGNOSIS — D225 Melanocytic nevi of trunk: Secondary | ICD-10-CM | POA: Insufficient documentation

## 2015-03-05 DIAGNOSIS — N800 Endometriosis of uterus: Secondary | ICD-10-CM | POA: Insufficient documentation

## 2015-03-05 DIAGNOSIS — J45909 Unspecified asthma, uncomplicated: Secondary | ICD-10-CM | POA: Insufficient documentation

## 2015-03-05 DIAGNOSIS — D235 Other benign neoplasm of skin of trunk: Secondary | ICD-10-CM

## 2015-03-05 DIAGNOSIS — D259 Leiomyoma of uterus, unspecified: Principal | ICD-10-CM | POA: Insufficient documentation

## 2015-03-05 DIAGNOSIS — Z90711 Acquired absence of uterus with remaining cervical stump: Secondary | ICD-10-CM | POA: Diagnosis present

## 2015-03-05 DIAGNOSIS — D251 Intramural leiomyoma of uterus: Secondary | ICD-10-CM | POA: Diagnosis not present

## 2015-03-05 DIAGNOSIS — N946 Dysmenorrhea, unspecified: Secondary | ICD-10-CM | POA: Diagnosis not present

## 2015-03-05 DIAGNOSIS — Q513 Bicornate uterus: Secondary | ICD-10-CM | POA: Diagnosis not present

## 2015-03-05 HISTORY — PX: LAPAROSCOPIC SUPRACERVICAL HYSTERECTOMY: SHX5399

## 2015-03-05 HISTORY — PX: LAPAROSCOPIC BILATERAL SALPINGECTOMY: SHX5889

## 2015-03-05 HISTORY — PX: NEVUS EXCISION: SHX5263

## 2015-03-05 LAB — COMPREHENSIVE METABOLIC PANEL
ALBUMIN: 4.3 g/dL (ref 3.5–5.0)
ALT: 17 U/L (ref 14–54)
ANION GAP: 7 (ref 5–15)
AST: 29 U/L (ref 15–41)
Alkaline Phosphatase: 48 U/L (ref 38–126)
BUN: <5 mg/dL — ABNORMAL LOW (ref 6–20)
CHLORIDE: 103 mmol/L (ref 101–111)
CO2: 28 mmol/L (ref 22–32)
Calcium: 9.1 mg/dL (ref 8.9–10.3)
Creatinine, Ser: 0.48 mg/dL (ref 0.44–1.00)
GFR calc non Af Amer: >60 mL/min (ref >60)
GLUCOSE: 107 mg/dL — AB (ref 65–99)
Potassium: 4 mmol/L (ref 3.5–5.1)
SODIUM: 138 mmol/L (ref 135–145)
Total Bilirubin: 1.3 mg/dL — ABNORMAL HIGH (ref 0.3–1.2)
Total Protein: 6.9 g/dL (ref 6.5–8.1)

## 2015-03-05 SURGERY — HYSTERECTOMY, SUPRACERVICAL, LAPAROSCOPIC
Anesthesia: General

## 2015-03-05 MED ORDER — SODIUM CHLORIDE 0.9 % IV SOLN
INTRAVENOUS | Status: DC
  Administered 2015-03-05: 12:00:00 via INTRAVENOUS

## 2015-03-05 MED ORDER — EPHEDRINE SULFATE 50 MG/ML IJ SOLN
INTRAMUSCULAR | Status: AC
  Filled 2015-03-05: qty 1

## 2015-03-05 MED ORDER — ONDANSETRON HCL 4 MG PO TABS: 4.0000 mg | ORAL_TABLET | Freq: Four times a day (QID) | ORAL | Status: DC | PRN

## 2015-03-05 MED ORDER — LIDOCAINE HCL (PF) 1 % IJ SOLN
INTRAMUSCULAR | Status: AC
  Filled 2015-03-05: qty 5

## 2015-03-05 MED ORDER — ONDANSETRON HCL 4 MG/2ML IJ SOLN: 4.0000 mg | Freq: Four times a day (QID) | INTRAMUSCULAR | Status: DC | PRN

## 2015-03-05 MED ORDER — PANTOPRAZOLE SODIUM 40 MG PO TBEC: 40.0000 mg | DELAYED_RELEASE_TABLET | Freq: Every day | ORAL | Status: DC

## 2015-03-05 MED ORDER — SODIUM CHLORIDE 0.9 % IR SOLN
Status: DC | PRN
  Administered 2015-03-05: 3000 mL

## 2015-03-05 MED ORDER — ALBUTEROL SULFATE (2.5 MG/3ML) 0.083% IN NEBU: 2.5000 mg | INHALATION_SOLUTION | Freq: Four times a day (QID) | RESPIRATORY_TRACT | Status: DC | PRN

## 2015-03-05 MED ORDER — LACTATED RINGERS IV SOLN
INTRAVENOUS | Status: DC
  Administered 2015-03-05 (×3): via INTRAVENOUS

## 2015-03-05 MED ORDER — FENTANYL CITRATE (PF) 100 MCG/2ML IJ SOLN
INTRAMUSCULAR | Status: DC | PRN
  Administered 2015-03-05 (×4): 50 ug via INTRAVENOUS
  Administered 2015-03-05 (×3): 100 ug via INTRAVENOUS

## 2015-03-05 MED ORDER — ROCURONIUM BROMIDE 50 MG/5ML IV SOLN
INTRAVENOUS | Status: AC
  Filled 2015-03-05: qty 2

## 2015-03-05 MED ORDER — BUPIVACAINE-EPINEPHRINE (PF) 0.5% -1:200000 IJ SOLN
INTRAMUSCULAR | Status: AC
  Filled 2015-03-05: qty 30

## 2015-03-05 MED ORDER — FENTANYL CITRATE (PF) 100 MCG/2ML IJ SOLN
INTRAMUSCULAR | Status: AC
  Filled 2015-03-05: qty 2

## 2015-03-05 MED ORDER — MIDAZOLAM HCL 2 MG/2ML IJ SOLN
INTRAMUSCULAR | Status: AC
  Filled 2015-03-05: qty 2

## 2015-03-05 MED ORDER — IBUPROFEN 600 MG PO TABS: 600.0000 mg | ORAL_TABLET | Freq: Four times a day (QID) | ORAL | Status: DC | PRN

## 2015-03-05 MED ORDER — GLYCOPYRROLATE 0.2 MG/ML IJ SOLN
INTRAMUSCULAR | Status: AC
  Filled 2015-03-05: qty 1

## 2015-03-05 MED ORDER — MIDAZOLAM HCL 5 MG/5ML IJ SOLN
INTRAMUSCULAR | Status: DC | PRN
  Administered 2015-03-05: 2 mg via INTRAVENOUS

## 2015-03-05 MED ORDER — ONDANSETRON HCL 4 MG/2ML IJ SOLN
4.0000 mg | Freq: Once | INTRAMUSCULAR | Status: AC
  Administered 2015-03-05: 4 mg via INTRAVENOUS

## 2015-03-05 MED ORDER — GLYCOPYRROLATE 0.2 MG/ML IJ SOLN
0.2000 mg | Freq: Once | INTRAMUSCULAR | Status: AC
  Administered 2015-03-05: 0.2 mg via INTRAVENOUS

## 2015-03-05 MED ORDER — 0.9 % SODIUM CHLORIDE (POUR BTL) OPTIME
TOPICAL | Status: DC | PRN
  Administered 2015-03-05: 1000 mL

## 2015-03-05 MED ORDER — ONDANSETRON HCL 4 MG/2ML IJ SOLN
INTRAMUSCULAR | Status: AC
  Filled 2015-03-05: qty 2

## 2015-03-05 MED ORDER — BUPIVACAINE HCL (PF) 0.5 % IJ SOLN
INTRAMUSCULAR | Status: AC
  Filled 2015-03-05: qty 30

## 2015-03-05 MED ORDER — OXYCODONE-ACETAMINOPHEN 5-325 MG PO TABS: 1.0000 | ORAL_TABLET | ORAL | Status: DC | PRN

## 2015-03-05 MED ORDER — KETOROLAC TROMETHAMINE 30 MG/ML IJ SOLN
30.0000 mg | Freq: Four times a day (QID) | INTRAMUSCULAR | Status: DC
  Administered 2015-03-05 – 2015-03-06 (×4): 30 mg via INTRAVENOUS
  Filled 2015-03-05 (×4): qty 1

## 2015-03-05 MED ORDER — PROPOFOL 10 MG/ML IV BOLUS
INTRAVENOUS | Status: AC
  Filled 2015-03-05: qty 20

## 2015-03-05 MED ORDER — ALBUTEROL SULFATE HFA 108 (90 BASE) MCG/ACT IN AERS: 2.0000 | INHALATION_SPRAY | Freq: Four times a day (QID) | RESPIRATORY_TRACT | Status: DC | PRN

## 2015-03-05 MED ORDER — CEFAZOLIN SODIUM-DEXTROSE 2-3 GM-% IV SOLR
2.0000 g | INTRAVENOUS | Status: AC
  Administered 2015-03-05: 2 g via INTRAVENOUS
  Filled 2015-03-05: qty 50

## 2015-03-05 MED ORDER — LORATADINE 10 MG PO TABS
10.0000 mg | ORAL_TABLET | Freq: Every day | ORAL | Status: DC
  Administered 2015-03-05: 10 mg via ORAL
  Filled 2015-03-05: qty 1

## 2015-03-05 MED ORDER — KETOROLAC TROMETHAMINE 30 MG/ML IJ SOLN
30.0000 mg | Freq: Once | INTRAMUSCULAR | Status: AC
  Administered 2015-03-05: 30 mg via INTRAVENOUS
  Filled 2015-03-05: qty 1

## 2015-03-05 MED ORDER — MIDAZOLAM HCL 2 MG/2ML IJ SOLN
1.0000 mg | INTRAMUSCULAR | Status: DC | PRN
  Administered 2015-03-05: 2 mg via INTRAVENOUS

## 2015-03-05 MED ORDER — DEXAMETHASONE SODIUM PHOSPHATE 4 MG/ML IJ SOLN
INTRAMUSCULAR | Status: AC
  Filled 2015-03-05: qty 1

## 2015-03-05 MED ORDER — ONDANSETRON HCL 4 MG/2ML IJ SOLN: 4.0000 mg | Freq: Once | INTRAMUSCULAR | Status: DC | PRN

## 2015-03-05 MED ORDER — PROPOFOL 10 MG/ML IV BOLUS
INTRAVENOUS | Status: DC | PRN
  Administered 2015-03-05: 160 mg via INTRAVENOUS

## 2015-03-05 MED ORDER — ROCURONIUM BROMIDE 100 MG/10ML IV SOLN
INTRAVENOUS | Status: DC | PRN
  Administered 2015-03-05: 10 mg via INTRAVENOUS
  Administered 2015-03-05: 45 mg via INTRAVENOUS
  Administered 2015-03-05: 10 mg via INTRAVENOUS
  Administered 2015-03-05: 5 mg via INTRAVENOUS

## 2015-03-05 MED ORDER — GLYCOPYRROLATE 0.2 MG/ML IJ SOLN
INTRAMUSCULAR | Status: AC
  Filled 2015-03-05: qty 3

## 2015-03-05 MED ORDER — GLYCOPYRROLATE 0.2 MG/ML IJ SOLN
INTRAMUSCULAR | Status: DC | PRN
  Administered 2015-03-05: 0.6 mg via INTRAVENOUS

## 2015-03-05 MED ORDER — BUPIVACAINE HCL (PF) 0.5 % IJ SOLN
INTRAMUSCULAR | Status: DC | PRN
  Administered 2015-03-05: 10 mL

## 2015-03-05 MED ORDER — HYDROMORPHONE HCL 1 MG/ML IJ SOLN
1.0000 mg | INTRAMUSCULAR | Status: DC | PRN
  Administered 2015-03-05 – 2015-03-06 (×4): 1 mg via INTRAVENOUS
  Filled 2015-03-05 (×4): qty 1

## 2015-03-05 MED ORDER — FENTANYL CITRATE (PF) 100 MCG/2ML IJ SOLN: 25.0000 ug | INTRAMUSCULAR | Status: DC | PRN

## 2015-03-05 MED ORDER — KETOROLAC TROMETHAMINE 30 MG/ML IJ SOLN: 30.0000 mg | Freq: Four times a day (QID) | INTRAMUSCULAR | Status: DC

## 2015-03-05 MED ORDER — NEOSTIGMINE METHYLSULFATE 10 MG/10ML IV SOLN
INTRAVENOUS | Status: AC
  Filled 2015-03-05: qty 1

## 2015-03-05 MED ORDER — SODIUM CHLORIDE 0.9 % IJ SOLN
INTRAMUSCULAR | Status: AC
  Filled 2015-03-05: qty 10

## 2015-03-05 MED ORDER — FENTANYL CITRATE (PF) 250 MCG/5ML IJ SOLN
INTRAMUSCULAR | Status: AC
  Filled 2015-03-05: qty 10

## 2015-03-05 MED ORDER — DEXAMETHASONE SODIUM PHOSPHATE 4 MG/ML IJ SOLN
4.0000 mg | Freq: Once | INTRAMUSCULAR | Status: AC
  Administered 2015-03-05: 4 mg via INTRAVENOUS

## 2015-03-05 MED ORDER — LIDOCAINE HCL 1 % IJ SOLN
INTRAMUSCULAR | Status: DC | PRN
  Administered 2015-03-05: 30 mg via INTRADERMAL

## 2015-03-05 MED ORDER — NEOSTIGMINE METHYLSULFATE 10 MG/10ML IV SOLN
INTRAVENOUS | Status: DC | PRN
  Administered 2015-03-05: 1 mg via INTRAVENOUS

## 2015-03-05 SURGICAL SUPPLY — 70 items
APPLIER CLIP UNV 5X34 EPIX (ENDOMECHANICALS) ×5 IMPLANT
BAG HAMPER (MISCELLANEOUS) ×5 IMPLANT
BANDAGE STRIP 1X3 FLEXIBLE (GAUZE/BANDAGES/DRESSINGS) ×25 IMPLANT
BENZOIN TINCTURE PRP APPL 2/3 (GAUZE/BANDAGES/DRESSINGS) ×5 IMPLANT
BLADE SURG SZ11 CARB STEEL (BLADE) ×5 IMPLANT
CATH ROBINSON RED A/P 16FR (CATHETERS) IMPLANT
CLOSURE STERI-STRIP 1/4X4 (GAUZE/BANDAGES/DRESSINGS) IMPLANT
CLOSURE WOUND 1/2 X4 (GAUZE/BANDAGES/DRESSINGS) ×1
CLOSURE WOUND 1/4 X3 (GAUZE/BANDAGES/DRESSINGS)
CLOTH BEACON ORANGE TIMEOUT ST (SAFETY) ×5 IMPLANT
COVER LIGHT HANDLE STERIS (MISCELLANEOUS) ×10 IMPLANT
DECANTER SPIKE VIAL GLASS SM (MISCELLANEOUS) ×5 IMPLANT
DISSECTOR BLUNT TIP ENDO 5MM (MISCELLANEOUS) IMPLANT
DRAPE PROXIMA HALF (DRAPES) ×5 IMPLANT
DRSG TEGADERM 4X4.75 (GAUZE/BANDAGES/DRESSINGS) ×5 IMPLANT
DURAPREP 26ML APPLICATOR (WOUND CARE) ×5 IMPLANT
ELECT REM PT RETURN 9FT ADLT (ELECTROSURGICAL) ×5
ELECTRODE REM PT RTRN 9FT ADLT (ELECTROSURGICAL) ×3 IMPLANT
FILTER SMOKE EVAC LAPAROSHD (FILTER) ×5 IMPLANT
FORMALIN 10 PREFIL 120ML (MISCELLANEOUS) ×10 IMPLANT
FORMALIN 10 PREFIL 480ML (MISCELLANEOUS) ×5 IMPLANT
GAUZE SPONGE 4X4 16PLY XRAY LF (GAUZE/BANDAGES/DRESSINGS) ×5 IMPLANT
GLOVE BIOGEL PI IND STRL 7.0 (GLOVE) ×9 IMPLANT
GLOVE BIOGEL PI IND STRL 7.5 (GLOVE) ×9 IMPLANT
GLOVE BIOGEL PI IND STRL 9 (GLOVE) ×3 IMPLANT
GLOVE BIOGEL PI INDICATOR 7.0 (GLOVE) ×6
GLOVE BIOGEL PI INDICATOR 7.5 (GLOVE) ×6
GLOVE BIOGEL PI INDICATOR 9 (GLOVE) ×2
GLOVE ECLIPSE 6.5 STRL STRAW (GLOVE) ×10 IMPLANT
GLOVE ECLIPSE 7.0 STRL STRAW (GLOVE) ×5 IMPLANT
GLOVE ECLIPSE 9.0 STRL (GLOVE) ×10 IMPLANT
GOWN SPEC L3 XXLG W/TWL (GOWN DISPOSABLE) ×10 IMPLANT
GOWN STRL REUS W/TWL LRG LVL3 (GOWN DISPOSABLE) ×10 IMPLANT
INST SET LAPROSCOPIC GYN AP (KITS) ×5 IMPLANT
IV NS IRRIG 3000ML ARTHROMATIC (IV SOLUTION) ×5 IMPLANT
KIT ROOM TURNOVER AP CYSTO (KITS) ×5 IMPLANT
KIT ROOM TURNOVER APOR (KITS) ×5 IMPLANT
LIQUID BAND (GAUZE/BANDAGES/DRESSINGS) IMPLANT
MANIFOLD NEPTUNE II (INSTRUMENTS) ×5 IMPLANT
NEEDLE HYPO 25X1 1.5 SAFETY (NEEDLE) ×5 IMPLANT
NEEDLE INSUFFLATION 120MM (ENDOMECHANICALS) ×5 IMPLANT
NEEDLE INSUFFLATION 14GA 120MM (NEEDLE) ×5 IMPLANT
NS IRRIG 1000ML POUR BTL (IV SOLUTION) ×5 IMPLANT
PACK PERI GYN (CUSTOM PROCEDURE TRAY) ×5 IMPLANT
PAD ARMBOARD 7.5X6 YLW CONV (MISCELLANEOUS) ×5 IMPLANT
SET BASIN LINEN APH (SET/KITS/TRAYS/PACK) ×5 IMPLANT
SET IRRIG TUBING LAPAROSCOPIC (IRRIGATION / IRRIGATOR) IMPLANT
SET TUBE IRRIG SUCTION NO TIP (IRRIGATION / IRRIGATOR) ×5 IMPLANT
SHEARS HARMONIC ACE PLUS 36CM (ENDOMECHANICALS) ×5 IMPLANT
SLEEVE ENDOPATH XCEL 5M (ENDOMECHANICALS) ×10 IMPLANT
SOLUTION ANTI FOG 6CC (MISCELLANEOUS) ×5 IMPLANT
STRIP CLOSURE SKIN 1/2X4 (GAUZE/BANDAGES/DRESSINGS) ×4 IMPLANT
STRIP CLOSURE SKIN 1/4X3 (GAUZE/BANDAGES/DRESSINGS) IMPLANT
SUT CHROMIC 0 CT 1 (SUTURE) IMPLANT
SUT CHROMIC 2 0 CT 1 (SUTURE) ×5 IMPLANT
SUT PLAIN CT 1/2CIR 2-0 27IN (SUTURE) ×5 IMPLANT
SUT VIC AB 0 CT1 27 (SUTURE) ×2
SUT VIC AB 0 CT1 27XBRD ANTBC (SUTURE) ×3 IMPLANT
SUT VIC AB 2-0 CT2 27 (SUTURE) IMPLANT
SUT VIC AB 4-0 PS2 27 (SUTURE) ×5 IMPLANT
SUT VICRYL 0 UR6 27IN ABS (SUTURE) ×5 IMPLANT
SYR BULB IRRIGATION 50ML (SYRINGE) ×5 IMPLANT
SYRINGE 10CC LL (SYRINGE) ×10 IMPLANT
TRAY FOLEY CATH SILVER 16FR (SET/KITS/TRAYS/PACK) ×5 IMPLANT
TROCAR ENDO BLADELESS 11MM (ENDOMECHANICALS) ×5 IMPLANT
TROCAR XCEL NON-BLD 5MMX100MML (ENDOMECHANICALS) ×10 IMPLANT
TROCAR XCEL UNIV SLVE 11M 100M (ENDOMECHANICALS) IMPLANT
TUBING INSUF HEATED (TUBING) ×5 IMPLANT
TUBING INSUFFLATION (TUBING) ×5 IMPLANT
WARMER LAPAROSCOPE (MISCELLANEOUS) ×5 IMPLANT

## 2015-03-05 NOTE — Anesthesia Postprocedure Evaluation (Signed)
Anesthesia Post Note  Patient: Tammy Garner  Procedure(s) Performed: Procedure(s) (LRB): LAPAROSCOPIC SUPRACERVICAL HYSTERECTOMY (N/A) LAPAROSCOPIC BILATERAL SALPINGECTOMY (Bilateral) NEVUS EXCISION FROM LEFT ABDOMINAL WALL (Left)  Patient location during evaluation: PACU Anesthesia Type: General Level of consciousness: awake, oriented and patient cooperative Pain management: pain level controlled Vital Signs Assessment: post-procedure vital signs reviewed and stable Respiratory status: nonlabored ventilation and patient connected to face mask oxygen Cardiovascular status: stable Postop Assessment: no signs of nausea or vomiting Anesthetic complications: no    Last Vitals:  Filed Vitals:   03/05/15 0725 03/05/15 0730  BP:  129/79  Temp:    Resp: 21 29    Last Pain: There were no vitals filed for this visit.               Wilba Mutz J

## 2015-03-05 NOTE — Op Note (Signed)
03/05/2015  10:35 AM  PATIENT:  Tammy Garner  50 y.o. female  PRE-OPERATIVE DIAGNOSIS:  irregular skin nevus dysmenorrhea   POST-OPERATIVE DIAGNOSIS:  irregular skin nevus dysmenorrhea   PROCEDURE:  Procedure(s): LAPAROSCOPIC SUPRACERVICAL HYSTERECTOMY (N/A) LAPAROSCOPIC BILATERAL SALPINGECTOMY (Bilateral) NEVUS EXCISION FROM LEFT ABDOMINAL WALL (Left)  SURGEON:  Surgeon(s) and Role:    * Jonnie Kind, MD - Primary    * Florian Buff, MD - Assisting  PHYSICIAN ASSISTANT: Darlis Loan M.D.  ASSISTANTS: Zoila Shutter CST   ANESTHESIA:   local and general  EBL:  Total I/O In: 2500 [I.V.:2500] Out: 400 [Urine:250; Blood:150]  BLOOD ADMINISTERED:none  DRAINS: Urinary Catheter (Foley)   LOCAL MEDICATIONS USED:  MARCAINE    and Amount: 10 ml in anterior abdominal wall  SPECIMEN:  Source of Specimen:  Uterine body, tubes, left skin nevus  DISPOSITION OF SPECIMEN:  PATHOLOGY  COUNTS:  YES  TOURNIQUET:  * No tourniquets in log *  DICTATION: .Dragon Dictation  PLAN OF CARE: Admit for overnight observation  PATIENT DISPOSITION:  PACU - hemodynamically stable.   Delay start of Pharmacological VTE agent (>24hrs) due to surgical blood loss or risk of bleeding: not applicable  Details of procedure patient was taken operating room prepped and draped for combined abdominal and vaginal procedure, with Foley catheter in place legs in low lithotomy position, out conducted and confirmed by surgical team. Uterine manipulator was placed in the cervix. An infraumbilical Veress needle placement was performed through a 1 cm skin incision. The skin nevus was trimmed from the left lower quadrants skin Transverse 2 cm suprapubic incision was well made as well as a 5 mm incision in each lower quadrant Veress needle was used to achieve pneumoperitoneum after water droplet technique confirmed easy rest of fluid into the abdominal cavity. The intra-abdominal pressure during filling was 8  mm, and then the laparoscopic 5 mm trocar was placed under direct visualization and no evidence of trauma associated with entry and identifiable suprapubic 11 mm trocar was placed under direct visualization as well as the right lower quadrant and left lower quadrant 5 mm trochars Attention was then directed to the uterus which could be elevated and the cornu grasped on the contralateral side. The right fallopian tube was taken out using the harmonic scalpel and salpingectomy performed in the tube extracted through the suprapubic site left tube was treated similarly. Attention then was directed to the left round ligament which was transected with harmonic ace 7 scalpel,, the broad ligament skeletonized and the left utero-ovarian ligament coagulated and transected bladder flap was developed anteriorly attention was then directed to the right side where round ligament was taken down lateral to the mid point, the right utero-ovarian ligament taken down with, harmonic ace 7, and the remainder of the bladder flap developed anteriorly and and vessels skeletonized. Returning to the left side, we coagulated the uterine vessels and transected them at the level just above the insertion of the uterosacral ligaments posteriorly, leaving the cervix intact on the right side the uterine vessels were similarly coagulated sufficiently to achieve hemostasis, and the lateral one third of the cervix freed up on this side. We returned to the left side and completed the amputation of the specimen off of the cervical stump. Pedicles were visualized inspected and confirmed as hemostatic agent was then directed to removal of specimen which was elevated up to the anterior abdominal wall grasped with Coker clamps through the suprapubic to the been expanded to 3 cm  transverse diameter by sharp dissection of the fascia. It was still challenging to extract the uterus. It was pulled into the incision and a corner of the uterus exteriorized  sufficiently to remove the uterus in pieces outside of the abdominal cavity.. Pelvis was inspected and no tissue remnants identifiable pedicles were inspected and confirmed as hemostatic. The peritoneum was closed at the suprapubic site, the fascia closed both the suprapubic and umbilical sites with 0 Vicryl, the subcutaneous tissues irrigated that the 3 cm skin incision suprapubic and then reapproximating subcutaneous layer with 2-0 chromic followed by subcuticular 4-0 Vicryl closure of all 4 skins incisions. Additionally the skin nevus removal from the left lower quadrant was also reapproximated subcuticular closure. The patient tolerated procedure well went to recovery in stable condition EBL 1 50 cc

## 2015-03-05 NOTE — Anesthesia Preprocedure Evaluation (Signed)
Anesthesia Evaluation  Patient identified by MRN, date of birth, ID band Patient awake    Reviewed: Allergy & Precautions, NPO status , Patient's Chart, lab work & pertinent test results  Airway Mallampati: I  TM Distance: >3 FB     Dental  (+) Teeth Intact, Dental Advisory Given   Pulmonary asthma (sinus allergies) , Current Smoker,    breath sounds clear to auscultation       Cardiovascular negative cardio ROS   Rhythm:Regular Rate:Normal     Neuro/Psych    GI/Hepatic negative GI ROS,   Endo/Other    Renal/GU      Musculoskeletal   Abdominal   Peds  Hematology   Anesthesia Other Findings   Reproductive/Obstetrics                             Anesthesia Physical Anesthesia Plan  ASA: II  Anesthesia Plan: General   Post-op Pain Management:    Induction: Intravenous  Airway Management Planned: Oral ETT  Additional Equipment:   Intra-op Plan:   Post-operative Plan: Extubation in OR  Informed Consent: I have reviewed the patients History and Physical, chart, labs and discussed the procedure including the risks, benefits and alternatives for the proposed anesthesia with the patient or authorized representative who has indicated his/her understanding and acceptance.     Plan Discussed with:   Anesthesia Plan Comments:         Anesthesia Quick Evaluation

## 2015-03-05 NOTE — Interval H&P Note (Signed)
History and Physical Interval Note:  03/05/2015 7:05 AM  Tammy Garner  has presented today for surgery, with the diagnosis of intramural fibroids sumucosal fibroids Dysmenorrhea, bicornate uterus  The various methods of treatment have been discussed with the patient and family. After consideration of risks, benefits and other options for treatment, the patient has consented to  Procedure(s): LAPAROSCOPIC SUPRACERVICAL HYSTERECTOMY (N/A) LAPAROSCOPIC BILATERAL SALPINGECTOMY (Bilateral) NEVUS EXCISION from left abdominal wall (Left) as a surgical intervention .  The patient's history has been reviewed, patient examined, no change in status, stable for surgery.  I have reviewed the patient's chart and labs.  Questions were answered to the patient's satisfaction.     Tammy Garner

## 2015-03-05 NOTE — Transfer of Care (Signed)
Immediate Anesthesia Transfer of Care Note  Patient: Tammy Garner  Procedure(s) Performed: Procedure(s): LAPAROSCOPIC SUPRACERVICAL HYSTERECTOMY (N/A) LAPAROSCOPIC BILATERAL SALPINGECTOMY (Bilateral) NEVUS EXCISION FROM LEFT ABDOMINAL WALL (Left)  Patient Location: PACU  Anesthesia Type:General  Level of Consciousness: awake and patient cooperative  Airway & Oxygen Therapy: Patient Spontanous Breathing and Patient connected to face mask oxygen  Post-op Assessment: Report given to RN, Post -op Vital signs reviewed and stable and Patient moving all extremities  Post vital signs: Reviewed and stable  Last Vitals:  Filed Vitals:   03/05/15 0725 03/05/15 0730  BP:  129/79  Temp:    Resp: 21 29    Complications: No apparent anesthesia complications

## 2015-03-05 NOTE — Progress Notes (Signed)
Day of Surgery Procedure(s) (LRB): LAPAROSCOPIC SUPRACERVICAL HYSTERECTOMY (N/A) LAPAROSCOPIC BILATERAL SALPINGECTOMY (Bilateral) NEVUS EXCISION FROM LEFT ABDOMINAL WALL (Left)  Subjective: Patient reports incisional pain and tolerating PO.    Objective: I have reviewed patient's vital signs and pain level 2.  General: alert, cooperative and no distress GI: normal findings: soft, non-tender  Assessment: s/p Procedure(s): LAPAROSCOPIC SUPRACERVICAL HYSTERECTOMY (N/A) LAPAROSCOPIC BILATERAL SALPINGECTOMY (Bilateral) NEVUS EXCISION FROM LEFT ABDOMINAL WALL (Left): stable  Plan: d/c foley     Tammy Garner V 03/05/2015, 5:24 PM

## 2015-03-05 NOTE — Anesthesia Procedure Notes (Signed)
Procedure Name: Intubation Date/Time: 03/05/2015 7:43 AM Performed by: Charmaine Downs Pre-anesthesia Checklist: Emergency Drugs available, Patient identified, Suction available and Patient being monitored Patient Re-evaluated:Patient Re-evaluated prior to inductionOxygen Delivery Method: Circle system utilized Preoxygenation: Pre-oxygenation with 100% oxygen Intubation Type: IV induction Ventilation: Mask ventilation without difficulty Laryngoscope Size: Mac and 3 Grade View: Grade I Tube type: Oral Tube size: 7.0 mm Number of attempts: 1 Airway Equipment and Method: Stylet and Oral airway Placement Confirmation: breath sounds checked- equal and bilateral,  positive ETCO2 and ETT inserted through vocal cords under direct vision Secured at: 22 cm Tube secured with: Tape Dental Injury: Teeth and Oropharynx as per pre-operative assessment

## 2015-03-05 NOTE — Brief Op Note (Signed)
03/05/2015  10:35 AM  PATIENT:  Tammy Garner  51 y.o. female  PRE-OPERATIVE DIAGNOSIS:  irregular skin nevus dysmenorrhea   POST-OPERATIVE DIAGNOSIS:  irregular skin nevus dysmenorrhea   PROCEDURE:  Procedure(s): LAPAROSCOPIC SUPRACERVICAL HYSTERECTOMY (N/A) LAPAROSCOPIC BILATERAL SALPINGECTOMY (Bilateral) NEVUS EXCISION FROM LEFT ABDOMINAL WALL (Left)  SURGEON:  Surgeon(s) and Role:    * Jonnie Kind, MD - Primary    * Florian Buff, MD - Assisting  PHYSICIAN ASSISTANT: Darlis Loan M.D.  ASSISTANTS: Zoila Shutter CST   ANESTHESIA:   local and general  EBL:  Total I/O In: 2500 [I.V.:2500] Out: 400 [Urine:250; Blood:150]  BLOOD ADMINISTERED:none  DRAINS: Urinary Catheter (Foley)   LOCAL MEDICATIONS USED:  MARCAINE    and Amount: 10 ml in anterior abdominal wall  SPECIMEN:  Source of Specimen:  Uterine body, tubes, left skin nevus  DISPOSITION OF SPECIMEN:  PATHOLOGY  COUNTS:  YES  TOURNIQUET:  * No tourniquets in log *  DICTATION: .Dragon Dictation  PLAN OF CARE: Admit for overnight observation  PATIENT DISPOSITION:  PACU - hemodynamically stable.   Delay start of Pharmacological VTE agent (>24hrs) due to surgical blood loss or risk of bleeding: not applicable

## 2015-03-06 ENCOUNTER — Encounter (HOSPITAL_COMMUNITY): Payer: Self-pay | Admitting: Obstetrics and Gynecology

## 2015-03-06 DIAGNOSIS — D259 Leiomyoma of uterus, unspecified: Secondary | ICD-10-CM | POA: Diagnosis not present

## 2015-03-06 LAB — CBC
HEMATOCRIT: 34 % — AB (ref 36.0–46.0)
HEMOGLOBIN: 11.8 g/dL — AB (ref 12.0–15.0)
MCH: 32.8 pg (ref 26.0–34.0)
MCHC: 34.7 g/dL (ref 30.0–36.0)
MCV: 94.4 fL (ref 78.0–100.0)
Platelets: 265 10*3/uL (ref 150–400)
RBC: 3.6 MIL/uL — AB (ref 3.87–5.11)
RDW: 12.7 % (ref 11.5–15.5)
WBC: 14.4 10*3/uL — ABNORMAL HIGH (ref 4.0–10.5)

## 2015-03-06 MED ORDER — IBUPROFEN 600 MG PO TABS: 600.0000 mg | ORAL_TABLET | Freq: Four times a day (QID) | ORAL | Status: DC | PRN

## 2015-03-06 MED ORDER — DOCUSATE SODIUM 100 MG PO CAPS: 100.0000 mg | ORAL_CAPSULE | Freq: Two times a day (BID) | ORAL | Status: DC | PRN

## 2015-03-06 MED ORDER — HYDROCODONE-ACETAMINOPHEN 10-325 MG PO TABS: 1.0000 | ORAL_TABLET | Freq: Four times a day (QID) | ORAL | Status: DC | PRN

## 2015-03-06 NOTE — Addendum Note (Signed)
Addendum  created 03/06/15 0831 by Charmaine Downs, CRNA   Modules edited: Clinical Notes   Clinical Notes:  File: [id number]

## 2015-03-06 NOTE — Discharge Instructions (Signed)
Abdominal Hysterectomy, Care After °These instructions give you information on caring for yourself after your procedure. Your doctor may also give you more specific instructions. Call your doctor if you have any problems or questions after your procedure.  °HOME CARE °It takes 4-6 weeks to recover from this surgery. Follow all of your doctor's instructions.  °· Only take medicines as told by your doctor. °· Change your bandage as told by your doctor. °· Return to your doctor to have your stitches taken out. °· Take showers for 2-3 weeks. Ask your doctor when it is okay to shower. °· Do not douche, use tampons, or have sex (intercourse) for at least 6 weeks or as told. °· Follow your doctor's advice about exercise, lifting objects, driving, and general activities. °· Get plenty of rest and sleep. °· Do not lift anything heavier than a gallon of milk (about 10 pounds [4.5 kilograms]) for the first month after surgery. °· Get back to your normal diet as told by your doctor. °· Do not drink alcohol until your doctor says it is okay. °· Take a medicine to help you poop (laxative) as told by your doctor. °· Eating foods high in fiber may help you poop. Eat a lot of raw fruits and vegetables, whole grains, and beans. °· Drink enough fluids to keep your pee (urine) clear or pale yellow. °· Have someone help you at home for 1-2 weeks after your surgery. °· Keep follow-up doctor visits as told. °GET HELP IF: °· You have chills or fever. °· You have puffiness, redness, or pain in area of the cut (incision). °· You have yellowish-white fluid (pus) coming from the cut. °· You have a bad smell coming from the cut or bandage. °· Your cut pulls apart. °· You feel dizzy or light-headed. °· You have pain or bleeding when you pee. °· You keep having watery poop (diarrhea). °· You keep feeling sick to your stomach (nauseous) or keep throwing up (vomiting). °· You have fluid (discharge) coming from your vagina. °· You have a  rash. °· You have a reaction to your medicine. °· You need stronger pain medicine. °GET HELP RIGHT AWAY IF:  °· You have a fever and your symptoms suddenly get worse. °· You have bad belly (abdominal) pain. °· You have chest pain. °· You are short of breath. °· You pass out (faint). °· You have pain, puffiness, or redness of your leg. °· You bleed a lot from your vagina and notice clumps of tissue (clots). °MAKE SURE YOU:  °· Understand these instructions. °· Will watch your condition. °· Will get help right away if you are not doing well or get worse. °  °This information is not intended to replace advice given to you by your health care provider. Make sure you discuss any questions you have with your health care provider. °  °Document Released: 10/01/2007 Document Revised: 12/27/2012 Document Reviewed: 10/14/2012 °Elsevier Interactive Patient Education ©2016 [employer] ° °

## 2015-03-06 NOTE — Progress Notes (Signed)
Pt discharged with all belongings, IV removed and intact. Husband to drive home.

## 2015-03-06 NOTE — Anesthesia Postprocedure Evaluation (Signed)
Anesthesia Post Note  Patient: Tammy Garner  Procedure(s) Performed: Procedure(s) (LRB): LAPAROSCOPIC SUPRACERVICAL HYSTERECTOMY (N/A) LAPAROSCOPIC BILATERAL SALPINGECTOMY (Bilateral) NEVUS EXCISION FROM LEFT ABDOMINAL WALL (Left)  Patient location during evaluation: Nursing Unit Anesthesia Type: General Level of consciousness: awake and alert, oriented and patient cooperative Pain management: pain level controlled Vital Signs Assessment: post-procedure vital signs reviewed and stable Respiratory status: nonlabored ventilation and respiratory function stable Cardiovascular status: blood pressure returned to baseline Postop Assessment: no signs of nausea or vomiting and adequate PO intake Anesthetic complications: no    Last Vitals:  Filed Vitals:   03/06/15 0100 03/06/15 0400  BP: 142/87 152/80  Pulse: 73 87  Temp: 37.2 C 37 C  Resp: 20 20    Last Pain:  Filed Vitals:   03/06/15 0645  PainSc: Asleep                 [name]

## 2015-03-06 NOTE — Discharge Summary (Signed)
Physician Discharge Summary  Patient ID: Tammy Garner MRN: [id number] DOB/AGE: [birth year] 50 y.o.  Admit date: 03/05/2015 Discharge date: 03/06/2015  Admission Diagnoses: Dysmenorrhea and pelvic pain abdominal skin nevus  Discharge Diagnoses:  Active Problems:   Dysmenorrhea   S/P laparoscopic supracervical hysterectomy with bilateral salpingectomy  abdominal skin nevus removed  Discharged Condition: good  Hospital Course: This 50 year old female was admitted for abdominal supracervical hysterectomy bilateral salpingectomy and removal of small abdominal irregular skin nevus. She underwent laparoscopic supracervical hysterectomy on 03/05/2015 with EBL 150 cc. Postoperatively the pain is quite manageable usually rated as 2 or less. Incisions remained clean she was afebrile. Blood pressure was poorly controlled and was [id number] systolic over [id number] diastolic she has no history of hypertension that she knew of prior to this event  Consults: None  Significant Diagnostic Studies: labs:  CBC Latest Ref Rng 03/06/2015 02/28/2015 01/31/2015  WBC 4.0 - 10.5 K/uL 14.4(H) 11.7(H) 10.4  Hemoglobin 12.0 - 15.0 g/dL 11.8(L) 15.4(H) -  Hematocrit 36.0 - 46.0 % 34.0(L) 43.8 46.8(H)  Platelets 150 - 400 K/uL 265 225 269      Treatments: surgery: Abdominal supracervical hysterectomy bilateral salpingectomy, removal of skin nevus left abdominal wall  Discharge Exam: Blood pressure 152/80, pulse 87, temperature 98.6 F (37 C), temperature source Oral, resp. rate 20, height 5\' 8"  (1.727 m), weight 186 lb (84.369 kg), last menstrual period 02/06/2015, SpO2 98 %. General appearance: alert, cooperative and no distress Head: Normocephalic, without obvious abnormality, atraumatic Eyes: conjunctivae/corneas clear. PERRL, EOM's intact. Fundi benign. Resp: clear to auscultation bilaterally GI: soft, non-tender; bowel sounds normal; no masses,  no organomegaly and Incisions clean and dry and  intact  Disposition: 01-Home or Self Care     Medication List    ASK your doctor about these medications        albuterol 108 (90 Base) MCG/ACT inhaler  Commonly known as:  PROVENTIL HFA;VENTOLIN HFA  Inhale 2 puffs into the lungs every 6 (six) hours as needed. Reported on 02/22/2015     cefdinir 300 MG capsule  Commonly known as:  OMNICEF  Take 1 capsule (300 mg total) by mouth 2 (two) times daily.     cetirizine 10 MG tablet  Commonly known as:  ZYRTEC  Take 10 mg by mouth daily as needed for allergies. Reported on 02/22/2015     cholecalciferol 1000 units tablet  Commonly known as:  VITAMIN D  Take 1,000 Units by mouth daily as needed (cold). Reported on [id number]     GARLIC PO  Take 1 tablet by mouth daily. Reported on 02/22/2015     multivitamin tablet  Take 1 tablet by mouth daily.     naproxen sodium 550 MG tablet  Commonly known as:  ANAPROX  Take 550 mg by mouth 2 (two) times daily as needed for moderate pain. Reported on 02/22/2015     oseltamivir 75 MG capsule  Commonly known as:  TAMIFLU  Take 1 capsule (75 mg total) by mouth 2 (two) times daily.     vitamin C 1000 MG tablet  Take 1,000 mg by mouth daily as needed (cold). Reported on 02/22/2015           Follow-up Information    Follow up with Jonnie Kind, MD In 1 week.   Specialties:  Obstetrics and Gynecology, Radiology   Why:  Postoperative visit   Contact information:   [address] [place] [address] [phone]       Signed:  Adir Schicker V 03/06/2015, 7:40 AM

## 2015-03-15 ENCOUNTER — Encounter: Payer: Self-pay | Admitting: Obstetrics and Gynecology

## 2015-03-15 ENCOUNTER — Ambulatory Visit (INDEPENDENT_AMBULATORY_CARE_PROVIDER_SITE_OTHER): Payer: 59 | Admitting: Obstetrics and Gynecology

## 2015-03-15 VITALS — BP 170/100 | Ht 68.0 in | Wt 191.0 lb

## 2015-03-15 DIAGNOSIS — Z90710 Acquired absence of both cervix and uterus: Secondary | ICD-10-CM

## 2015-03-15 DIAGNOSIS — Z08 Encounter for follow-up examination after completed treatment for malignant neoplasm: Secondary | ICD-10-CM

## 2015-03-15 DIAGNOSIS — Z90711 Acquired absence of uterus with remaining cervical stump: Secondary | ICD-10-CM

## 2015-03-15 MED ORDER — HYDROCODONE-ACETAMINOPHEN 10-325 MG PO TABS: 1.0000 | ORAL_TABLET | Freq: Four times a day (QID) | ORAL | Status: DC | PRN

## 2015-03-15 NOTE — Progress Notes (Signed)
Patient ID: Tammy Garner, female   DOB: 26-Dec-1965, 50 y.o.   MRN: [id number]   Subjective:  Tammy Garner is a 50 y.o. female now 10 days status post supracervical hysterectomy and laparoscopic bilateral salpingectomy, and nevuz excision from left abdominal wall.  NEVUS Pt notes that she has showered and that there hasn't been any issues. Pt notes that she had LE swelling that resolved when she d/c ibuprofen. Pt voices that she would like another Rx for hydrocodone. Pt notes that she has been ambulating as a form of exercise. Pt denies any pain or any other symptoms at this time.   Review of Systems Negative   Diet:   normal   Bowel movements : normal.  The patient is not having any pain. She does use HC 10 q 8 hr for chronic back pain.  Objective:  BP 170/100 mmHg  Ht 5\' 8"  (1.727 m)  Wt 191 lb (86.637 kg)  BMI 29.05 kg/m2  LMP 02/06/2015 (Exact Date) General:Well developed, well nourished.  No acute distress. Abdomen: Bowel sounds normal, soft, non-tender. Pelvic Exam:    External Genitalia:  N/A.    Vagina: N/A    Cervix: N/A    Uterus: N/A    Adnexa/Bimanual: N/A  Incision(s):   Healing well, no drainage, no erythema, no hernia, no swelling, no dehiscence.  Assessment:  Post-Op 10 days s/p supracervical hysterectomy and supracervical hysterectomy and laparoscopic bilateral salpingectomy, and nevuz excision from left abdominal wall.     Doing well postoperatively.   Plan:  1.Wound care discussed  2. . current medications. Refill hydrocodone Rx 10 mg x 30 tab 3. Activity restrictions: no lifting more than 20 pounds and no overhead lifting 4. return to work: not applicable. 5. Follow up in 4 weeks.  By signing my name below, I, Soijett Blue, attest that this documentation has been prepared under the direction and in the presence of Jonnie Kind, MD. Electronically Signed: [place], ED Scribe. 03/15/2015. 9:02 AM.  I personally performed the services  described in this documentation, which was SCRIBED in my presence. The recorded information has been reviewed and considered accurate. It has been edited as necessary during review. Jonnie Kind, MD

## 2015-03-15 NOTE — Progress Notes (Signed)
Patient ID: Tammy Garner, female   DOB: 1965/10/11, 50 y.o.   MRN: [id number] Pt here today for post op. Pt states that she does not have any problems or concerns at this time.

## 2015-03-15 NOTE — Progress Notes (Signed)
°  Subjective:  Tammy Garner is a 50 y.o. female now 10 days status post supracervical hysterectomy and laparoscopic bilateral salpingectomy, and nevuz excision from left abdominal wall.  NEVUS Pt notes that she has showered and that there hasn't been any issues. Pt notes that she had LE swelling that resolved when she d/c ibuprofen. Pt voices that she would like another Rx for hydrocodone. Pt notes that she has been ambulating as a form of exercise. Pt denies any pain or any other symptoms at this time.   Review of Systems Negative   Diet:   normal   Bowel movements : normal.  The patient is not having any pain. She does use HC 10 q 8 hr for chronic back pain.  Objective:  BP 170/100 mmHg   Ht 5\' 8"  (1.727 m)   Wt 191 lb (86.637 kg)   BMI 29.05 kg/m2   LMP 02/06/2015 (Exact Date) General:Well developed, well nourished.  No acute distress. Abdomen: Bowel sounds normal, soft, non-tender. Pelvic Exam:    External Genitalia:  N/A.    Vagina: N/A    Cervix: N/A    Uterus: N/A    Adnexa/Bimanual: N/A  Incision(s):   Healing well, no drainage, no erythema, no hernia, no swelling, no dehiscence.  Assessment:  Post-Op 10 days s/p supracervical hysterectomy and supracervical hysterectomy and laparoscopic bilateral salpingectomy, and nevuz excision from left abdominal wall.     Doing well postoperatively.   Plan:  1.Wound care discussed  2. . current medications. Refill hydrocodone Rx 10 mg x 30 tab 3. Activity restrictions: no lifting more than 20 pounds and no overhead lifting 4. return to work: not applicable. 5. Follow up in 4 weeks.  By signing my name below, I, Soijett Blue, attest that this documentation has been prepared under the direction and in the presence of Jonnie Kind, MD. Electronically Signed: [place], ED Scribe. 03/15/2015. 9:02 AM.  I personally performed the services described in this documentation, which was SCRIBED in my presence. The recorded  information has been reviewed and considered accurate. It has been edited as necessary during review. Jonnie Kind, MD

## 2015-04-05 ENCOUNTER — Encounter: Payer: Self-pay | Admitting: Obstetrics and Gynecology

## 2015-04-05 ENCOUNTER — Ambulatory Visit (INDEPENDENT_AMBULATORY_CARE_PROVIDER_SITE_OTHER): Payer: 59 | Admitting: Obstetrics and Gynecology

## 2015-04-05 VITALS — BP 100/60 | Ht 68.0 in | Wt 188.5 lb

## 2015-04-05 DIAGNOSIS — Z9889 Other specified postprocedural states: Secondary | ICD-10-CM

## 2015-04-05 DIAGNOSIS — Z90711 Acquired absence of uterus with remaining cervical stump: Secondary | ICD-10-CM

## 2015-04-05 NOTE — Progress Notes (Signed)
  Subjective:  Tammy Garner is a 50 y.o. female now 4 weeks, 3 days weeks status post supracervical hysterectomy and laparoscopic bilateral salpingectomy, and nevuz excision from left abdominal wall..   Pt notes that she has not done any heavy lifting, but she has completed light duty cleaning around the house. Pt reports that she hasn't had any sexual intercourse as of yet. Pt denies any pain or any other symptoms.   Review of Systems Negative except    Diet:   N/A   Bowel movements : normal.  The patient is not having any pain.  Objective:  BP 100/60 mmHg  Ht 5\' 8"  (1.727 m)  Wt 188 lb 8 oz (85.503 kg)  BMI 28.67 kg/m2  LMP 02/06/2015 (Exact Date) General:Well developed, well nourished.  No acute distress. Abdomen: Bowel sounds normal, soft, non-tender. Pelvic Exam:    External Genitalia:  Normal.    Vagina: Normal    Cervix: Normal    Uterus:     Adnexa/Bimanual: Normal  Incision(s):   Healing well, no drainage, no erythema, no hernia, no swelling, no dehiscence,     Assessment:  Post-Op 4 weeks, 3 days weeks s/p supracervical hysterectomy and laparoscopic bilateral salpingectomy, and nevuz excision from left abdominal wall.   Doing well postoperatively.   Plan:  1.Wound care discussed   2. . current medications. 3. Activity restrictions: no lifting more than 20 pounds and no overhead lifting 4. return to work: not applicable. 5. Follow up in 1 year PRN.  By signing my name below, I, Tammy Garner, attest that this documentation has been prepared under the direction and in the presence of Jonnie Kind, MD. Electronically Signed: Soijett Garner, ED Scribe. 04/05/2015. 9:51 AM.  I personally performed the services described in this documentation, which was SCRIBED in my presence. The recorded information has been reviewed and considered accurate. It has been edited as necessary during review. Jonnie Kind, MD

## 2015-08-28 ENCOUNTER — Encounter: Payer: Self-pay | Admitting: Family Medicine

## 2015-08-28 ENCOUNTER — Ambulatory Visit (INDEPENDENT_AMBULATORY_CARE_PROVIDER_SITE_OTHER): Payer: 59 | Admitting: Family Medicine

## 2015-08-28 VITALS — Temp 98.2°F | Ht 68.0 in | Wt 191.6 lb

## 2015-08-28 DIAGNOSIS — R109 Unspecified abdominal pain: Secondary | ICD-10-CM | POA: Diagnosis not present

## 2015-08-28 LAB — POCT URINALYSIS DIPSTICK
PH UA: 6
Spec Grav, UA: 1.015

## 2015-08-28 MED ORDER — CIPROFLOXACIN HCL 250 MG PO TABS: 250.0000 mg | ORAL_TABLET | Freq: Two times a day (BID) | ORAL | 0 refills | Status: DC

## 2015-08-28 NOTE — Progress Notes (Signed)
   Subjective:    Patient ID: Tammy Garner, female    DOB: 1965/03/28, 50 y.o.   MRN: [id number]  HPI Patient arrives with c/o lower abdominal pain-patient describes the pain as an ache that seems to get better with urination but no true dysuria.  Complicated by major GYN surgery 6 months ago with hysterectomy. Had considerable scarring and some challenges with the surgery.  Subacute low pelvic 10:18 12 days. Started in the right lower quadrant. No fever no chills no vomiting.  Slight dysuria   Review of Systems No headache, no major weight loss or weight gain, no chest pain no back pain abdominal pain no change in bowel habits complete ROS otherwise negative     Objective:   Physical Exam  Alert pleasant no acute distress lungs clear heart regular in rhythm no CVA tenderness no low back tenderness mild diffuse suprapubic tenderness.  Urinalysis mostly unremarkable couple white blood cells per high-power field. Patient has been drinking a lot of fluids and cranberry juice      Assessment & Plan:  Impression possible UTI versus flare of pelvic pain plan Cipro twice a day. Urine culture. If gets better in UTI confirmed great if persists and does not respond with no UTI on culture, we'll need to get back with GYN

## 2015-08-30 LAB — URINE CULTURE

## 2015-08-30 LAB — PLEASE NOTE

## 2015-09-06 ENCOUNTER — Encounter: Payer: Self-pay | Admitting: Obstetrics and Gynecology

## 2015-09-06 ENCOUNTER — Ambulatory Visit (INDEPENDENT_AMBULATORY_CARE_PROVIDER_SITE_OTHER): Payer: 59 | Admitting: Obstetrics and Gynecology

## 2015-09-06 DIAGNOSIS — G8929 Other chronic pain: Secondary | ICD-10-CM | POA: Insufficient documentation

## 2015-09-06 DIAGNOSIS — R1031 Right lower quadrant pain: Secondary | ICD-10-CM

## 2015-09-06 NOTE — Progress Notes (Signed)
[facility] Visit  09/06/15           Patient name: Tammy Garner MRN [id number]  Date of birth: 1965-02-11  CC & HPI:  Tammy Garner is a 50 y.o. female presenting today for non-radiating, intermittent lower abdominal pain. She reports the pain began to the RLQ with occasional sharp pain and progressed to a soreness to the general lower abdomen. She was seen by PCP who started her on Cipro for possible UTI. She states she had a negative urine culture. She had a supracervical hysterectomy with bilateral salpingectomy 6 months ago without any complications.     ROS:  ROS +lower abdominal pain -dysuria Negative other than noted in HPI and PMH  Pertinent History Reviewed:   Reviewed: Significant for  Medical         Past Medical History:  Diagnosis Date  . Allergy   . Asthma   . Dysmenorrhea 10/04/2013  . Environmental allergies   . Family history of adverse reaction to anesthesia    mom had facial and neck swelling with TAH 35 yrs ago; pt has had no prob  . Fibroid 10/16/2013  . Ovarian cyst 10/16/2013                              Surgical Hx:    Past Surgical History:  Procedure Laterality Date  . CESAREAN SECTION    . DILATION AND CURETTAGE OF UTERUS    . ENDOMETRIAL ABLATION    . LAPAROSCOPIC BILATERAL SALPINGECTOMY Bilateral 03/05/2015   Procedure: LAPAROSCOPIC BILATERAL SALPINGECTOMY;  Surgeon: Jonnie Kind, MD;  Location: [facility];  Service: Gynecology;  Laterality: Bilateral;  . LAPAROSCOPIC SUPRACERVICAL HYSTERECTOMY N/A 03/05/2015   Procedure: LAPAROSCOPIC SUPRACERVICAL HYSTERECTOMY;  Surgeon: Jonnie Kind, MD;  Location: [facility];  Service: Gynecology;  Laterality: N/A;  . NEVUS EXCISION Left 03/05/2015   Procedure: NEVUS EXCISION FROM LEFT ABDOMINAL WALL;  Surgeon: Jonnie Kind, MD;  Location: [facility];  Service: Gynecology;  Laterality: Left;  . TUBAL LIGATION     Medications: Reviewed & Updated - see associated section     Current Outpatient Prescriptions:  .  albuterol (PROVENTIL HFA;VENTOLIN HFA) 108 (90 BASE) MCG/ACT inhaler, Inhale 2 puffs into the lungs every 6 (six) hours as needed. Reported on 02/22/2015, Disp: , Rfl:  .  cetirizine (ZYRTEC) 10 MG tablet, Take 10 mg by mouth daily as needed for allergies. Reported on 02/22/2015, Disp: , Rfl:  .  ciprofloxacin (CIPRO) 250 MG tablet, Take 1 tablet (250 mg total) by mouth 2 (two) times daily., Disp: 20 tablet, Rfl: 0   Social History: Reviewed -  reports that she has been smoking Cigarettes.  She has a 15.00 pack-year smoking history. She has never used smokeless tobacco.  Objective Findings:  Vitals: Blood pressure (!) 160/100, pulse 94, height 5\' 8"  (1.727 m), weight 191 lb (86.6 kg), last menstrual period 02/06/2015.  Physical Examination: General appearance - alert, well appearing, and in no distress and oriented to person, place, and time Mental status - alert, oriented to person, place, and time, normal mood, behavior, speech, dress, motor activity, and thought processes Abdomen - soft, nontender, nondistended, no masses or organomegaly Pelvic -  VULVA: normal appearing vulva with no masses, tenderness or lesions,  VAGINA: normal appearing vagina with normal color and discharge, no lesions,  CERVIX: well supported, midline, normal appearing cervix without discharge or lesions,  UTERUS:  surgically absent ,  ADNEXA: normal adnexa in size, nontender and no masses  The provider spent over 15 minutes with the visit, including pre visit review, documentation, with >than 50% spent in counseling and coordination of care.  Assessment & Plan:   A:  1. Normal GYN exam status post supracervical hysterectomy   P:  1. Reassure patient 2. Increase physical activity 3. Return PRN    By signing my name below, I, Sonum Patel, attest that this documentation has been prepared under the direction and in the presence of Jonnie Kind, MD. Electronically  Signed: Sonum Patel, Education administrator. 09/06/15. 1:09 PM.  I personally performed the services described in this documentation, which was SCRIBED in my presence. The recorded information has been reviewed and considered accurate. It has been edited as necessary during review. Jonnie Kind, MD

## 2016-07-06 ENCOUNTER — Ambulatory Visit (INDEPENDENT_AMBULATORY_CARE_PROVIDER_SITE_OTHER): Payer: 59 | Admitting: Obstetrics and Gynecology

## 2016-07-06 ENCOUNTER — Encounter: Payer: Self-pay | Admitting: Obstetrics and Gynecology

## 2016-07-06 VITALS — BP 132/94 | HR 62 | Ht 68.0 in | Wt 188.8 lb

## 2016-07-06 DIAGNOSIS — Z1212 Encounter for screening for malignant neoplasm of rectum: Secondary | ICD-10-CM | POA: Diagnosis not present

## 2016-07-06 DIAGNOSIS — Z01419 Encounter for gynecological examination (general) (routine) without abnormal findings: Secondary | ICD-10-CM | POA: Diagnosis not present

## 2016-07-06 DIAGNOSIS — M5431 Sciatica, right side: Secondary | ICD-10-CM | POA: Insufficient documentation

## 2016-07-06 LAB — HEMOCCULT GUIAC POC 1CARD (OFFICE): FECAL OCCULT BLD: NEGATIVE

## 2016-07-06 NOTE — Progress Notes (Signed)
Assessment:  Normal pelvic exam status post supracervical hysterectomy 2. Sciatica suspected low back origin 3. Intermittent right lower quadrant pain may represent ovarian cyst   Plan:  1. Next pap due 2 yrs 2. Return annually or prn 3    Annual mammogram advised 4. Follow up GYN when abdominal pain occurs 5. Refer to [facility] [place] for weakness  6. Patient to let us know if she has right lower quadrant pain again at which time we'll consider transvaginal ultrasound Subjective:  Tammy Garner is a 51 y.o. female [id number] who presents for annual exam. Patient's last menstrual period was 02/06/2015 (exact date). The patient has complaints today of sharp right lower abdominal pain, intermittently for ~ 1 year. Pt also notes pain into the right groin/right inner thigh region. She states she began noticing this pain after having a supracervical hysterectomy in Feb. 2017. She notes her pain is worse when she goes up the stairs leading with the RLE. She also reports a mild limp when ambulating. No h/o back trauma/injury but she notes h/o sciatica with back and RLE pain. No alleviating factors noted. She denies dyspareunia. Pt has no other acute complaints or associated symptoms at this time. Pt's last pap was in 2017.  The patient does notice that when her right buttock and right leg her at her particularly on the inner thigh that she notices her right foot time seems to drop during her gait  The following portions of the patient's history were reviewed and updated as appropriate: allergies, current medications, past family history, past medical history, past social history, past surgical history and problem list. Past Medical History:  Diagnosis Date  . Allergy   . Asthma   . Dysmenorrhea 10/04/2013  . Environmental allergies   . Family history of adverse reaction to anesthesia    mom had facial and neck swelling with TAH 35 yrs ago; pt has had no prob  . Fibroid 10/16/2013  .  Ovarian cyst 10/16/2013    Past Surgical History:  Procedure Laterality Date  . CESAREAN SECTION    . DILATION AND CURETTAGE OF UTERUS    . ENDOMETRIAL ABLATION    . LAPAROSCOPIC BILATERAL SALPINGECTOMY Bilateral 03/05/2015   Procedure: LAPAROSCOPIC BILATERAL SALPINGECTOMY;  Surgeon: Jonnie Kind, MD;  Location: [facility];  Service: Gynecology;  Laterality: Bilateral;  . LAPAROSCOPIC SUPRACERVICAL HYSTERECTOMY N/A 03/05/2015   Procedure: LAPAROSCOPIC SUPRACERVICAL HYSTERECTOMY;  Surgeon: Jonnie Kind, MD;  Location: [facility];  Service: Gynecology;  Laterality: N/A;  . NEVUS EXCISION Left 03/05/2015   Procedure: NEVUS EXCISION FROM LEFT ABDOMINAL WALL;  Surgeon: Jonnie Kind, MD;  Location: [facility];  Service: Gynecology;  Laterality: Left;  . TUBAL LIGATION       Current Outpatient Prescriptions:  .  cetirizine (ZYRTEC) 10 MG tablet, Take 10 mg by mouth daily as needed for allergies. Reported on 02/22/2015, Disp: , Rfl:  .  HORSE CHESTNUT PO, Take by mouth., Disp: , Rfl:  .  Multiple Vitamin (MULTIVITAMIN) LIQD, Take 5 mLs by mouth daily., Disp: , Rfl:  .  albuterol (PROVENTIL HFA;VENTOLIN HFA) 108 (90 BASE) MCG/ACT inhaler, Inhale 2 puffs into the lungs every 6 (six) hours as needed. Reported on 02/22/2015, Disp: , Rfl:   Review of Systems Constitutional: negative Gastrointestinal: positive for abdominal pain Genitourinary: negative  Objective:  BP (!) 132/94 (BP Location: Right Arm, Patient Position: Sitting, Cuff Size: Normal)   Pulse 62   Ht 5\' 8"  (1.727 m)   Wt  188 lb 12.8 oz (85.6 kg)   LMP 02/06/2015 (Exact Date)   BMI 28.71 kg/m    BMI: Body mass index is 28.71 kg/m.  General Appearance: Alert, appropriate appearance for age. No acute distress HEENT: Grossly normal Neck / Thyroid:  Cardiovascular: RRR; normal S1, S2, no murmur Lungs: CTA bilaterally Back: No CVAT Breast Exam: No dimpling, nipple retraction or discharge. No masses or nodes. Gastrointestinal: Soft,  non-tender, no masses or organomegaly Pelvic Exam: External genitalia: normal general appearance Vaginal: normal mucosa without prolapse or lesions Cervix: normal appearance. Mobile, well-supported  Adnexa: normal bimanual exam; No tenderness or masses Uterus: absent Rectovaginal: Guaiac negative; External hemorrhoid  Lymphatic Exam: Non-palpable nodes in neck, clavicular, axillary, or inguinal regions  Skin: no rash or abnormalities Neurologic: Normal gait and speech, no tremor. Sensation intact.  Psychiatric: Alert and oriented, appropriate affect.  Urinalysis:Not done  Mallory Shirk. MD Pgr [phone] 9:10 AM    By signing my name below, I, [name], attest that this documentation has been prepared under the direction and in the presence of Jonnie Kind, MD . Electronically Signed: [name], Scribe. 07/06/2016. 9:10 AM. I personally performed the services described in this documentation, which was SCRIBED in my presence. The recorded information has been reviewed and considered accurate. It has been edited as necessary during review. Jonnie Kind, MD

## 2017-02-24 ENCOUNTER — Telehealth: Payer: Self-pay | Admitting: Family Medicine

## 2017-02-24 NOTE — Telephone Encounter (Signed)
We received surgical clearance request from [facility].  Patient will need a surgical clearance appointment.  I left a message to return call.  See form in basket up front.

## 2017-02-24 NOTE — Telephone Encounter (Signed)
Appointment scheduled for 03/15/17.

## 2017-03-15 ENCOUNTER — Encounter: Payer: Self-pay | Admitting: Family Medicine

## 2017-03-15 ENCOUNTER — Ambulatory Visit (INDEPENDENT_AMBULATORY_CARE_PROVIDER_SITE_OTHER): Payer: 59 | Admitting: Family Medicine

## 2017-03-15 VITALS — BP 118/74 | Ht 68.0 in | Wt 187.0 lb

## 2017-03-15 DIAGNOSIS — M1611 Unilateral primary osteoarthritis, right hip: Secondary | ICD-10-CM | POA: Diagnosis not present

## 2017-03-15 NOTE — Progress Notes (Signed)
   Subjective:    Patient ID: Tammy Garner, female    DOB: 02/27/1965, 52 y.o.   MRN: [id number]  HPIpt arrives for surgical clearance. 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 Surgery is scheduled May 29th.   Lost cartilage in the right hip, had xray which showed serious loss of xcartilage  Ps fam hx wirh fa getting right hip replacement and mo with both knees replaced    Has modified activities with the painful hip   Patient unfortunately still smokes.  No recent need to use an inhaler  Review of Systems No headache, no major weight loss or weight gain, no chest pain no back pain abdominal pain no change in bowel habits complete ROS otherwise negative     Objective:   Physical Exam  Alert vitals stable, NAD. Blood pressure good on repeat. HEENT normal. Lungs clear. Heart regular rate and rhythm.   Positive right hip pain with internal and external rotation      Assessment & Plan:  Impression progressive right hip arthritis.  Patient due to have hip replacement soon.  Patient feels injured way back in school.  She still smokes with occasional cough but otherwise no acute or chronic complaints.  Should be able to handle the surgery well.

## 2017-04-08 ENCOUNTER — Ambulatory Visit: Payer: Self-pay | Admitting: Orthopedic Surgery

## 2017-05-21 NOTE — Progress Notes (Signed)
03-15-17 Surgical Clearance from Dr. Wolfgang Phoenix on chart

## 2017-05-21 NOTE — Patient Instructions (Addendum)
Tammy Garner  05/21/2017   Your procedure is scheduled on: 06-02-17   Report to [facility] Main  Entrance    Report to Admitting at 5:30 AM    Call this number if you have problems the morning of surgery [phone]   Remember: Do not eat food or drink liquids :After Midnight.     Take these medicines the morning of surgery with A SIP OF WATER: You bring and use your nasal spray as needed.                                You may not have any metal on your body including hair pins and              piercings  Do not wear jewelry, make-up, lotions, powders or perfumes, deodorant             Do not wear nail polish.  Do not shave  48 hours prior to surgery.                Do not bring valuables to the hospital. [place].  Contacts, dentures or bridgework may not be worn into surgery.  Leave suitcase in the car. After surgery it may be brought to your room.      Special Instructions: N/A              Please read over the following fact sheets you were given: _____________________________________________________________________         [facility] - Preparing for Surgery Before surgery, you can play an important role.  Because skin is not sterile, your skin needs to be as free of germs as possible.  You can reduce the number of germs on your skin by washing with CHG (chlorahexidine gluconate) soap before surgery.  CHG is an antiseptic cleaner which kills germs and bonds with the skin to continue killing germs even after washing. Please DO NOT use if you have an allergy to CHG or antibacterial soaps.  If your skin becomes reddened/irritated stop using the CHG and inform your nurse when you arrive at Short Stay. Do not shave (including legs and underarms) for at least 48 hours prior to the first CHG shower.  You may shave your face/neck. Please follow these instructions carefully:  1.  Shower with CHG Soap  the night before surgery and the  morning of Surgery.  2.  If you choose to wash your hair, wash your hair first as usual with your  normal  shampoo.  3.  After you shampoo, rinse your hair and body thoroughly to remove the  shampoo.                           4.  Use CHG as you would any other liquid soap.  You can apply chg directly  to the skin and wash                       Gently with a scrungie or clean washcloth.  5.  Apply the CHG Soap to your body ONLY FROM THE NECK DOWN.   Do not use on face/ open  Wound or open sores. Avoid contact with eyes, ears mouth and genitals (private parts).                       Wash face,  Genitals (private parts) with your normal soap.             6.  Wash thoroughly, paying special attention to the area where your surgery  will be performed.  7.  Thoroughly rinse your body with warm water from the neck down.  8.  DO NOT shower/wash with your normal soap after using and rinsing off  the CHG Soap.                9.  Pat yourself dry with a clean towel.            10.  Wear clean pajamas.            11.  Place clean sheets on your bed the night of your first shower and do not  sleep with pets. Day of Surgery : Do not apply any lotions/deodorants the morning of surgery.  Please wear clean clothes to the hospital/surgery center.  FAILURE TO FOLLOW THESE INSTRUCTIONS MAY RESULT IN THE CANCELLATION OF YOUR SURGERY PATIENT SIGNATURE_________________________________  NURSE SIGNATURE__________________________________  ________________________________________________________________________   Adam Phenix  An incentive spirometer is a tool that can help keep your lungs clear and active. This tool measures how well you are filling your lungs with each breath. Taking long deep breaths may help reverse or decrease the chance of developing breathing (pulmonary) problems (especially infection) following:  A long period of time when  you are unable to move or be active. BEFORE THE PROCEDURE   If the spirometer includes an indicator to show your best effort, your nurse or respiratory therapist will set it to a desired goal.  If possible, sit up straight or lean slightly forward. Try not to slouch.  Hold the incentive spirometer in an upright position. INSTRUCTIONS FOR USE  1. Sit on the edge of your bed if possible, or sit up as far as you can in bed or on a chair. 2. Hold the incentive spirometer in an upright position. 3. Breathe out normally. 4. Place the mouthpiece in your mouth and seal your lips tightly around it. 5. Breathe in slowly and as deeply as possible, raising the piston or the ball toward the top of the column. 6. Hold your breath for 3-5 seconds or for as long as possible. Allow the piston or ball to fall to the bottom of the column. 7. Remove the mouthpiece from your mouth and breathe out normally. 8. Rest for a few seconds and repeat Steps 1 through 7 at least 10 times every 1-2 hours when you are awake. Take your time and take a few normal breaths between deep breaths. 9. The spirometer may include an indicator to show your best effort. Use the indicator as a goal to work toward during each repetition. 10. After each set of 10 deep breaths, practice coughing to be sure your lungs are clear. If you have an incision (the cut made at the time of surgery), support your incision when coughing by placing a pillow or rolled up towels firmly against it. Once you are able to get out of bed, walk around indoors and cough well. You may stop using the incentive spirometer when instructed by your caregiver.  RISKS AND COMPLICATIONS  Take your time so you do not get  dizzy or light-headed.  If you are in pain, you may need to take or ask for pain medication before doing incentive spirometry. It is harder to take a deep breath if you are having pain. AFTER USE  Rest and breathe slowly and easily.  It can be  helpful to keep track of a log of your progress. Your caregiver can provide you with a simple table to help with this. If you are using the spirometer at home, follow these instructions: [place] IF:   You are having difficultly using the spirometer.  You have trouble using the spirometer as often as instructed.  Your pain medication is not giving enough relief while using the spirometer.  You develop fever of 100.5 F (38.1 C) or higher. SEEK IMMEDIATE MEDICAL CARE IF:   You cough up bloody sputum that had not been present before.  You develop fever of 102 F (38.9 C) or greater.  You develop worsening pain at or near the incision site. MAKE SURE YOU:   Understand these instructions.  Will watch your condition.  Will get help right away if you are not doing well or get worse. Document Released: 05/04/2006 Document Revised: 03/16/2011 Document Reviewed: 07/05/2006 Exit[employer] Patient Information 2014 Exit[employer], [place].   ________________________________________________________________________  WHAT IS A BLOOD TRANSFUSION? Blood Transfusion Information  A transfusion is the replacement of blood or some of its parts. Blood is made up of multiple cells which provide different functions.  Red blood cells carry oxygen and are used for blood loss replacement.  White blood cells fight against infection.  Platelets control bleeding.  Plasma helps clot blood.  Other blood products are available for specialized needs, such as hemophilia or other clotting disorders. BEFORE THE TRANSFUSION  Who gives blood for transfusions?   Healthy volunteers who are fully evaluated to make sure their blood is safe. This is blood bank blood. Transfusion therapy is the safest it has ever been in the practice of medicine. Before blood is taken from a donor, a complete history is taken to make sure that person has no history of diseases nor engages in risky social behavior (examples are  intravenous drug use or sexual activity with multiple partners). The donor's travel history is screened to minimize risk of transmitting infections, such as malaria. The donated blood is tested for signs of infectious diseases, such as HIV and hepatitis. The blood is then tested to be sure it is compatible with you in order to minimize the chance of a transfusion reaction. If you or a relative donates blood, this is often done in anticipation of surgery and is not appropriate for emergency situations. It takes many days to process the donated blood. RISKS AND COMPLICATIONS Although transfusion therapy is very safe and saves many lives, the main dangers of transfusion include:   Getting an infectious disease.  Developing a transfusion reaction. This is an allergic reaction to something in the blood you were given. Every precaution is taken to prevent this. The decision to have a blood transfusion has been considered carefully by your caregiver before blood is given. Blood is not given unless the benefits outweigh the risks. AFTER THE TRANSFUSION  Right after receiving a blood transfusion, you will usually feel much better and more energetic. This is especially true if your red blood cells have gotten low (anemic). The transfusion raises the level of the red blood cells which carry oxygen, and this usually causes an energy increase.  The nurse administering the transfusion will  monitor you carefully for complications. HOME CARE INSTRUCTIONS  No special instructions are needed after a transfusion. You may find your energy is better. Speak with your caregiver about any limitations on activity for underlying diseases you may have. SEEK MEDICAL CARE IF:   Your condition is not improving after your transfusion.  You develop redness or irritation at the intravenous (IV) site. SEEK IMMEDIATE MEDICAL CARE IF:  Any of the following symptoms occur over the next 12 hours:  Shaking chills.  You have a  temperature by mouth above 102 F (38.9 C), not controlled by medicine.  Chest, back, or muscle pain.  People around you feel you are not acting correctly or are confused.  Shortness of breath or difficulty breathing.  Dizziness and fainting.  You get a rash or develop hives.  You have a decrease in urine output.  Your urine turns a dark color or changes to pink, red, or brown. Any of the following symptoms occur over the next 10 days:  You have a temperature by mouth above 102 F (38.9 C), not controlled by medicine.  Shortness of breath.  Weakness after normal activity.  The white part of the eye turns yellow (jaundice).  You have a decrease in the amount of urine or are urinating less often.  Your urine turns a dark color or changes to pink, red, or brown. Document Released: 12/20/1999 Document Revised: 03/16/2011 Document Reviewed: 08/08/2007 [facility] Patient Information 2014 [place], [place].  _______________________________________________________________________

## 2017-05-25 ENCOUNTER — Other Ambulatory Visit: Payer: Self-pay

## 2017-05-25 ENCOUNTER — Encounter (HOSPITAL_COMMUNITY): Payer: Self-pay

## 2017-05-25 ENCOUNTER — Encounter (HOSPITAL_COMMUNITY)
Admission: RE | Admit: 2017-05-25 | Discharge: 2017-05-25 | Disposition: A | Discharge status: Home / Self Care | Payer: 59 | Source: Ambulatory Visit | Attending: Orthopedic Surgery | Admitting: Orthopedic Surgery

## 2017-05-25 DIAGNOSIS — Z01812 Encounter for preprocedural laboratory examination: Secondary | ICD-10-CM | POA: Insufficient documentation

## 2017-05-25 DIAGNOSIS — M1611 Unilateral primary osteoarthritis, right hip: Secondary | ICD-10-CM | POA: Diagnosis not present

## 2017-05-25 DIAGNOSIS — Z0183 Encounter for blood typing: Secondary | ICD-10-CM | POA: Diagnosis not present

## 2017-05-25 LAB — COMPREHENSIVE METABOLIC PANEL
ALBUMIN: 4.8 g/dL (ref 3.5–5.0)
ALT: 14 U/L (ref 14–54)
AST: 23 U/L (ref 15–41)
Alkaline Phosphatase: 58 U/L (ref 38–126)
Anion gap: 9 (ref 5–15)
BUN: 9 mg/dL (ref 6–20)
CHLORIDE: 102 mmol/L (ref 101–111)
CO2: 24 mmol/L (ref 22–32)
CREATININE: 0.62 mg/dL (ref 0.44–1.00)
Calcium: 9.2 mg/dL (ref 8.9–10.3)
GFR calc Af Amer: >60 mL/min (ref >60)
Glucose, Bld: 93 mg/dL (ref 65–99)
POTASSIUM: 4.4 mmol/L (ref 3.5–5.1)
SODIUM: 135 mmol/L (ref 135–145)
Total Bilirubin: 0.7 mg/dL (ref 0.3–1.2)
Total Protein: 7.9 g/dL (ref 6.5–8.1)

## 2017-05-25 LAB — SURGICAL PCR SCREEN
MRSA, PCR: INVALID — AB
STAPHYLOCOCCUS AUREUS: INVALID — AB

## 2017-05-25 LAB — CBC
HCT: 45.5 % (ref 36.0–46.0)
Hemoglobin: 15.8 g/dL — ABNORMAL HIGH (ref 12.0–15.0)
MCH: 32.7 pg (ref 26.0–34.0)
MCHC: 34.7 g/dL (ref 30.0–36.0)
MCV: 94.2 fL (ref 78.0–100.0)
PLATELETS: 274 10*3/uL (ref 150–400)
RBC: 4.83 MIL/uL (ref 3.87–5.11)
RDW: 12.9 % (ref 11.5–15.5)
WBC: 11.4 10*3/uL — AB (ref 4.0–10.5)

## 2017-05-25 LAB — APTT: APTT: 43 s — AB (ref 24–36)

## 2017-05-25 LAB — PROTIME-INR
INR: 0.97
Prothrombin Time: 12.8 seconds (ref 11.4–15.2)

## 2017-05-25 LAB — ABO/RH: ABO/RH(D): A POS

## 2017-05-26 LAB — MRSA CULTURE: Culture: NOT DETECTED

## 2017-05-26 NOTE — Progress Notes (Signed)
05-25-17 [facility] result routed to Dr. Wynelle Link for review.

## 2017-06-01 MED ORDER — TRANEXAMIC ACID 1000 MG/10ML IV SOLN
1000.0000 mg | INTRAVENOUS | Status: AC
  Administered 2017-06-02: 1000 mg via INTRAVENOUS
  Filled 2017-06-01: qty 1100

## 2017-06-01 MED ORDER — TRANEXAMIC ACID 1000 MG/10ML IV SOLN
1000.0000 mg | INTRAVENOUS | Status: DC
  Filled 2017-06-01: qty 10

## 2017-06-01 NOTE — H&P (Signed)
TOTAL HIP ADMISSION H&P  Patient is admitted for right total hip arthroplasty.  Subjective:  Chief Complaint: right hip pain  HPI: Tammy Garner, 52 y.o. female, has a history of pain and functional disability in the right hip(s) due to arthritis and patient has failed non-surgical conservative treatments for greater than 12 weeks to include NSAID's and/or analgesics, corticosteriod injections, flexibility and strengthening excercises and activity modification.  Onset of symptoms was gradual starting 3 years ago with gradually worsening course since that time.The patient noted no past surgery on the right hip(s).  Patient currently rates pain in the right hip at 8 out of 10 with activity. Patient has night pain, worsening of pain with activity and weight bearing, pain that interfers with activities of daily living and pain with passive range of motion. Patient has evidence of subchondral cysts, periarticular osteophytes and joint space narrowing by imaging studies. This condition presents safety issues increasing the risk of falls.  There is no current active infection.  Patient Active Problem List   Diagnosis Date Noted  . Sciatica of right side 07/06/2016  . Abdominal pain, chronic, right lower quadrant 09/06/2015  . S/P laparoscopic supracervical hysterectomy 03/05/2015  . Ovarian cyst 10/16/2013  . Dysmenorrhea 10/04/2013   Past Medical History:  Diagnosis Date  . Allergy   . Asthma   . Dysmenorrhea 10/04/2013  . Environmental allergies   . Family history of adverse reaction to anesthesia    mom had facial and neck swelling with TAH 35 yrs ago; pt has had no prob  . Fibroid 10/16/2013  . Ovarian cyst 10/16/2013    Past Surgical History:  Procedure Laterality Date  . CESAREAN SECTION    . DILATION AND CURETTAGE OF UTERUS    . ENDOMETRIAL ABLATION    . LAPAROSCOPIC BILATERAL SALPINGECTOMY Bilateral 03/05/2015   Procedure: LAPAROSCOPIC BILATERAL SALPINGECTOMY;  Surgeon: Jonnie Kind, MD;  Location: [facility];  Service: Gynecology;  Laterality: Bilateral;  . LAPAROSCOPIC SUPRACERVICAL HYSTERECTOMY N/A 03/05/2015   Procedure: LAPAROSCOPIC SUPRACERVICAL HYSTERECTOMY;  Surgeon: Jonnie Kind, MD;  Location: [facility];  Service: Gynecology;  Laterality: N/A;  . NEVUS EXCISION Left 03/05/2015   Procedure: NEVUS EXCISION FROM LEFT ABDOMINAL WALL;  Surgeon: Jonnie Kind, MD;  Location: [facility];  Service: Gynecology;  Laterality: Left;  . TUBAL LIGATION      No current facility-administered medications for this encounter.    Current Outpatient Medications  Medication Sig Dispense Refill Last Dose  . acetaminophen (TYLENOL) 500 MG tablet Take 1,000 mg by mouth every 6 (six) hours as needed (for pain.).     Marland Kitchen cetirizine (ZYRTEC) 10 MG tablet Take 10 mg by mouth every evening. Reported on 02/22/2015   Taking  . fluticasone (FLONASE) 50 MCG/ACT nasal spray Place 1 spray into both nostrils every evening.     Marland Kitchen ibuprofen (ADVIL,MOTRIN) 200 MG tablet Take 400-600 mg by mouth every 8 (eight) hours as needed (for pain/headaches.).     Marland Kitchen Multiple Vitamin (MULTIVITAMIN WITH MINERALS) TABS tablet Take 1 tablet by mouth daily.      Allergies  Allergen Reactions  . Augmentin [Amoxicillin-Pot Clavulanate]     GI upset Has patient had a PCN reaction causing immediate rash, facial/tongue/throat swelling, SOB or lightheadedness with hypotension: Yes Has patient had a PCN reaction causing severe rash involving mucus membranes or skin necrosis: No Has patient had a PCN reaction that required hospitalization: No Has patient had a PCN reaction occurring within the last 10 years:  No If all of the above answers are "NO", then may proceed with Cephalosporin use.     Social History   Tobacco Use  . Smoking status: Current Every Day Smoker    Packs/day: 0.25    Years: 30.00    Pack years: 7.50    Types: Cigarettes  . Smokeless tobacco: Never Used  Substance Use Topics  . Alcohol use:  Yes    Comment: occ.    Family History  Problem Relation Age of Onset  . Hypertension Mother   . Heart disease Mother   . Cancer Mother        cervical  . Hypertension Father   . Asthma Sister   . Emphysema Maternal Grandfather   . Cancer Paternal Grandmother        stomach  . Heart attack Paternal Grandfather      Review of Systems  Constitutional: Negative.   HENT: Negative.   Eyes: Negative.   Respiratory: Negative.   Cardiovascular: Negative.   Gastrointestinal: Negative.   Genitourinary: Negative.   Musculoskeletal: Positive for joint pain and myalgias. Negative for back pain, falls and neck pain.  Skin: Negative.   Neurological: Negative.   Endo/Heme/Allergies: Negative.   Psychiatric/Behavioral: Negative.     Objective:  Physical Exam  Constitutional: She is oriented to person, place, and time. She appears well-developed. No distress.  Overweight  HENT:  Head: Normocephalic and atraumatic.  Right Ear: External ear normal.  Left Ear: External ear normal.  Nose: Nose normal.  Mouth/Throat: Oropharynx is clear and moist.  Eyes: Conjunctivae and EOM are normal.  Neck: Normal range of motion. Neck supple.  Cardiovascular: Normal rate, regular rhythm, normal heart sounds and intact distal pulses.  No murmur heard. Respiratory: Effort normal and breath sounds normal. No respiratory distress. She has no wheezes.  GI: Soft. Bowel sounds are normal. She exhibits no distension. There is no tenderness.  Musculoskeletal:  Her RIGHT hip shows flexion to 90 with no internal rotation about 10 of external rotation and 10 of abduction. Her LEFT hip has normal motion. She has a lot of tenderness over the lateral aspect of the RIGHT hip. She has a significant antalgic gait pattern on the RIGHT.   Neurological: She is alert and oriented to person, place, and time. She has normal strength. No sensory deficit.  Skin: No rash noted. She is not diaphoretic. No erythema.   Psychiatric: She has a normal mood and affect. Her behavior is normal.    Ht: 5 ft 8.5 in  Wt: 186 lbs  BMI: 27.9  BP: 122/74  Pain Scale: 8  Imaging Review Plain radiographs demonstrate severe degenerative joint disease of the right hip(s). The bone quality appears to be good for age and reported activity level.    Preoperative templating of the joint replacement has been completed, documented, and submitted to the Operating Room personnel in order to optimize intra-operative equipment management.     Assessment/Plan:  End stage primary osteoarthritis, right hip(s)  The patient history, physical examination, clinical judgement of the provider and imaging studies are consistent with end stage degenerative joint disease of the right hip(s) and total hip arthroplasty is deemed medically necessary. The treatment options including medical management, injection therapy, arthroscopy and arthroplasty were discussed at length. The risks and benefits of total hip arthroplasty were presented and reviewed. The risks due to aseptic loosening, infection, stiffness, dislocation/subluxation,  thromboembolic complications and other imponderables were discussed.  The patient acknowledged the explanation, agreed to proceed  with the plan and consent was signed. Patient is being admitted for inpatient treatment for surgery, pain control, PT, OT, prophylactic antibiotics, VTE prophylaxis, progressive ambulation and ADL's and discharge planning.The patient is planning to be discharged home with HEP.   Therapy Plans: HEP Disposition: Home with husband Planned DVT prophylaxis: Xarelto 10mg  daily DME needed: none PCP: Dr. Mickie Hillier Other: TXA IV    Ardeen Jourdain, PA-C

## 2017-06-01 NOTE — Anesthesia Preprocedure Evaluation (Addendum)
Anesthesia Evaluation  Patient identified by MRN, date of birth, ID band Patient awake    Reviewed: Allergy & Precautions, H&P , NPO status , Patient's Chart, lab work & pertinent test results  Airway Mallampati: I  TM Distance: >3 FB Neck ROM: Full    Dental no notable dental hx. (+) Teeth Intact, Dental Advisory Given   Pulmonary asthma , Current Smoker,    Pulmonary exam normal breath sounds clear to auscultation       Cardiovascular Exercise Tolerance: Good negative cardio ROS   Rhythm:Regular Rate:Normal     Neuro/Psych negative neurological ROS  negative psych ROS   GI/Hepatic negative GI ROS, Neg liver ROS,   Endo/Other  negative endocrine ROS  Renal/GU negative Renal ROS  negative genitourinary   Musculoskeletal   Abdominal   Peds  Hematology negative hematology ROS (+)   Anesthesia Other Findings   Reproductive/Obstetrics negative OB ROS                            Anesthesia Physical Anesthesia Plan  ASA: II  Anesthesia Plan: Spinal   Post-op Pain Management:    Induction: Intravenous  PONV Risk Score and Plan: 2 and Ondansetron, Propofol infusion and Midazolam  Airway Management Planned: Simple Face Mask  Additional Equipment:   Intra-op Plan:   Post-operative Plan:   Informed Consent: I have reviewed the patients History and Physical, chart, labs and discussed the procedure including the risks, benefits and alternatives for the proposed anesthesia with the patient or authorized representative who has indicated his/her understanding and acceptance.   Dental advisory given  Plan Discussed with: CRNA  Anesthesia Plan Comments:         Anesthesia Quick Evaluation

## 2017-06-02 ENCOUNTER — Encounter (HOSPITAL_COMMUNITY): Payer: Self-pay | Admitting: *Deleted

## 2017-06-02 ENCOUNTER — Other Ambulatory Visit: Payer: Self-pay

## 2017-06-02 ENCOUNTER — Inpatient Hospital Stay (HOSPITAL_COMMUNITY): Payer: 59 | Admitting: Anesthesiology

## 2017-06-02 ENCOUNTER — Inpatient Hospital Stay (HOSPITAL_COMMUNITY): Payer: 59

## 2017-06-02 ENCOUNTER — Inpatient Hospital Stay (HOSPITAL_COMMUNITY)
Admission: RE | Admit: 2017-06-02 | Discharge: 2017-06-03 | DRG: 470 | Disposition: A | Discharge status: Home / Self Care | Payer: 59 | Attending: Orthopedic Surgery | Admitting: Orthopedic Surgery

## 2017-06-02 ENCOUNTER — Encounter (HOSPITAL_COMMUNITY)
Admission: RE | Disposition: A | Discharge status: Home / Self Care | Payer: Self-pay | Source: Home / Self Care | Attending: Orthopedic Surgery

## 2017-06-02 DIAGNOSIS — Z79899 Other long term (current) drug therapy: Secondary | ICD-10-CM | POA: Diagnosis not present

## 2017-06-02 DIAGNOSIS — F17210 Nicotine dependence, cigarettes, uncomplicated: Secondary | ICD-10-CM | POA: Diagnosis present

## 2017-06-02 DIAGNOSIS — M1611 Unilateral primary osteoarthritis, right hip: Secondary | ICD-10-CM | POA: Diagnosis present

## 2017-06-02 DIAGNOSIS — M169 Osteoarthritis of hip, unspecified: Secondary | ICD-10-CM | POA: Diagnosis present

## 2017-06-02 DIAGNOSIS — Z825 Family history of asthma and other chronic lower respiratory diseases: Secondary | ICD-10-CM

## 2017-06-02 DIAGNOSIS — Z96649 Presence of unspecified artificial hip joint: Secondary | ICD-10-CM

## 2017-06-02 DIAGNOSIS — Z888 Allergy status to other drugs, medicaments and biological substances status: Secondary | ICD-10-CM

## 2017-06-02 HISTORY — PX: TOTAL HIP ARTHROPLASTY: SHX124

## 2017-06-02 LAB — TYPE AND SCREEN
ABO/RH(D): A POS
Antibody Screen: NEGATIVE

## 2017-06-02 SURGERY — ARTHROPLASTY, HIP, TOTAL, ANTERIOR APPROACH
Anesthesia: Spinal | Site: Hip | Laterality: Right

## 2017-06-02 MED ORDER — PROPOFOL 10 MG/ML IV BOLUS
INTRAVENOUS | Status: DC | PRN
Start: 2017-06-02 — End: 2017-06-02
  Administered 2017-06-02 (×2): 10 mg via INTRAVENOUS

## 2017-06-02 MED ORDER — VANCOMYCIN HCL IN DEXTROSE 1-5 GM/200ML-% IV SOLN
1000.0000 mg | INTRAVENOUS | Status: AC
  Administered 2017-06-02: 1000 mg via INTRAVENOUS
  Filled 2017-06-02: qty 200

## 2017-06-02 MED ORDER — MIDAZOLAM HCL 2 MG/2ML IJ SOLN
INTRAMUSCULAR | Status: AC
  Filled 2017-06-02: qty 2

## 2017-06-02 MED ORDER — STERILE WATER FOR IRRIGATION IR SOLN
Status: DC | PRN
  Administered 2017-06-02: 2000 mL

## 2017-06-02 MED ORDER — OXYCODONE HCL 5 MG PO TABS: 10.0000 mg | ORAL_TABLET | ORAL | Status: DC | PRN

## 2017-06-02 MED ORDER — ONDANSETRON HCL 4 MG PO TABS: 4.0000 mg | ORAL_TABLET | Freq: Four times a day (QID) | ORAL | Status: DC | PRN

## 2017-06-02 MED ORDER — PROPOFOL 500 MG/50ML IV EMUL
INTRAVENOUS | Status: DC | PRN
  Administered 2017-06-02: 75 ug/kg/min via INTRAVENOUS

## 2017-06-02 MED ORDER — RIVAROXABAN 10 MG PO TABS
10.0000 mg | ORAL_TABLET | Freq: Every day | ORAL | Status: DC
  Administered 2017-06-03: 10 mg via ORAL
  Filled 2017-06-02: qty 1

## 2017-06-02 MED ORDER — METOCLOPRAMIDE HCL 5 MG PO TABS: 5.0000 mg | ORAL_TABLET | Freq: Three times a day (TID) | ORAL | Status: DC | PRN

## 2017-06-02 MED ORDER — PHENYLEPHRINE 40 MCG/ML (10ML) SYRINGE FOR IV PUSH (FOR BLOOD PRESSURE SUPPORT)
PREFILLED_SYRINGE | INTRAVENOUS | Status: DC | PRN
  Administered 2017-06-02 (×2): 40 ug via INTRAVENOUS
  Administered 2017-06-02: 80 ug via INTRAVENOUS
  Administered 2017-06-02: 40 ug via INTRAVENOUS
  Administered 2017-06-02 (×3): 80 ug via INTRAVENOUS
  Administered 2017-06-02: 40 ug via INTRAVENOUS

## 2017-06-02 MED ORDER — DIPHENHYDRAMINE HCL 12.5 MG/5ML PO ELIX: 12.5000 mg | ORAL_SOLUTION | ORAL | Status: DC | PRN

## 2017-06-02 MED ORDER — PROPOFOL 10 MG/ML IV BOLUS
INTRAVENOUS | Status: AC
  Filled 2017-06-02: qty 20

## 2017-06-02 MED ORDER — TRAMADOL HCL 50 MG PO TABS
50.0000 mg | ORAL_TABLET | Freq: Four times a day (QID) | ORAL | Status: DC | PRN
  Administered 2017-06-02: 100 mg via ORAL
  Filled 2017-06-02: qty 2

## 2017-06-02 MED ORDER — BUPIVACAINE IN DEXTROSE 0.75-8.25 % IT SOLN
INTRATHECAL | Status: DC | PRN
  Administered 2017-06-02: 2 mL via INTRATHECAL

## 2017-06-02 MED ORDER — FLUTICASONE PROPIONATE 50 MCG/ACT NA SUSP
1.0000 | Freq: Every evening | NASAL | Status: DC
  Filled 2017-06-02: qty 16

## 2017-06-02 MED ORDER — DEXAMETHASONE SODIUM PHOSPHATE 10 MG/ML IJ SOLN
INTRAMUSCULAR | Status: AC
  Filled 2017-06-02: qty 1

## 2017-06-02 MED ORDER — MIDAZOLAM HCL 5 MG/5ML IJ SOLN
INTRAMUSCULAR | Status: DC | PRN
  Administered 2017-06-02: 2 mg via INTRAVENOUS

## 2017-06-02 MED ORDER — ONDANSETRON HCL 4 MG/2ML IJ SOLN: 4.0000 mg | Freq: Four times a day (QID) | INTRAMUSCULAR | Status: DC | PRN

## 2017-06-02 MED ORDER — ACETAMINOPHEN 325 MG PO TABS: 325.0000 mg | ORAL_TABLET | Freq: Four times a day (QID) | ORAL | Status: DC | PRN

## 2017-06-02 MED ORDER — DEXAMETHASONE SODIUM PHOSPHATE 10 MG/ML IJ SOLN
10.0000 mg | Freq: Once | INTRAMUSCULAR | Status: AC
  Administered 2017-06-02: 10 mg via INTRAVENOUS

## 2017-06-02 MED ORDER — FLEET ENEMA 7-19 GM/118ML RE ENEM: 1.0000 | ENEMA | Freq: Once | RECTAL | Status: DC | PRN

## 2017-06-02 MED ORDER — MENTHOL 3 MG MT LOZG: 1.0000 | LOZENGE | OROMUCOSAL | Status: DC | PRN

## 2017-06-02 MED ORDER — PHENOL 1.4 % MT LIQD: 1.0000 | OROMUCOSAL | Status: DC | PRN

## 2017-06-02 MED ORDER — LACTATED RINGERS IV SOLN
INTRAVENOUS | Status: DC
  Administered 2017-06-02 (×2): via INTRAVENOUS

## 2017-06-02 MED ORDER — DEXAMETHASONE SODIUM PHOSPHATE 10 MG/ML IJ SOLN
10.0000 mg | Freq: Once | INTRAMUSCULAR | Status: AC
  Administered 2017-06-03: 10 mg via INTRAVENOUS
  Filled 2017-06-02: qty 1

## 2017-06-02 MED ORDER — CHLORHEXIDINE GLUCONATE 4 % EX LIQD: 60.0000 mL | Freq: Once | CUTANEOUS | Status: DC

## 2017-06-02 MED ORDER — VANCOMYCIN HCL IN DEXTROSE 1-5 GM/200ML-% IV SOLN
1000.0000 mg | Freq: Two times a day (BID) | INTRAVENOUS | Status: AC
  Administered 2017-06-02: 1000 mg via INTRAVENOUS
  Filled 2017-06-02: qty 200

## 2017-06-02 MED ORDER — PROPOFOL 10 MG/ML IV BOLUS
INTRAVENOUS | Status: AC
  Filled 2017-06-02: qty 40

## 2017-06-02 MED ORDER — ACETAMINOPHEN 10 MG/ML IV SOLN
1000.0000 mg | Freq: Once | INTRAVENOUS | Status: AC
  Administered 2017-06-02: 1000 mg via INTRAVENOUS
  Filled 2017-06-02: qty 100

## 2017-06-02 MED ORDER — FENTANYL CITRATE (PF) 100 MCG/2ML IJ SOLN
INTRAMUSCULAR | Status: AC
  Filled 2017-06-02: qty 2

## 2017-06-02 MED ORDER — METHOCARBAMOL 500 MG PO TABS
500.0000 mg | ORAL_TABLET | Freq: Four times a day (QID) | ORAL | Status: DC | PRN
  Administered 2017-06-02 – 2017-06-03 (×2): 500 mg via ORAL
  Filled 2017-06-02 (×2): qty 1

## 2017-06-02 MED ORDER — OXYCODONE HCL 5 MG PO TABS
5.0000 mg | ORAL_TABLET | ORAL | Status: DC | PRN
  Administered 2017-06-02 (×2): 10 mg via ORAL
  Administered 2017-06-02 – 2017-06-03 (×2): 5 mg via ORAL
  Administered 2017-06-03: 10 mg via ORAL
  Filled 2017-06-02 (×4): qty 2
  Filled 2017-06-02: qty 1

## 2017-06-02 MED ORDER — SODIUM CHLORIDE 0.9 % IV SOLN
INTRAVENOUS | Status: DC
  Administered 2017-06-02: 14:00:00 via INTRAVENOUS

## 2017-06-02 MED ORDER — BISACODYL 10 MG RE SUPP: 10.0000 mg | Freq: Every day | RECTAL | Status: DC | PRN

## 2017-06-02 MED ORDER — ACETAMINOPHEN 500 MG PO TABS
1000.0000 mg | ORAL_TABLET | Freq: Four times a day (QID) | ORAL | Status: AC
  Administered 2017-06-02 – 2017-06-03 (×3): 1000 mg via ORAL
  Filled 2017-06-02 (×4): qty 2

## 2017-06-02 MED ORDER — HYDROMORPHONE HCL 1 MG/ML IJ SOLN
0.5000 mg | INTRAMUSCULAR | Status: DC | PRN
  Administered 2017-06-02: 1 mg via INTRAVENOUS
  Filled 2017-06-02: qty 1

## 2017-06-02 MED ORDER — METOCLOPRAMIDE HCL 5 MG/ML IJ SOLN: 5.0000 mg | Freq: Three times a day (TID) | INTRAMUSCULAR | Status: DC | PRN

## 2017-06-02 MED ORDER — SODIUM CHLORIDE 0.9 % IR SOLN
Status: DC | PRN
  Administered 2017-06-02: 1000 mL

## 2017-06-02 MED ORDER — DEXTROSE 5 % IV SOLN
500.0000 mg | Freq: Four times a day (QID) | INTRAVENOUS | Status: DC | PRN
  Administered 2017-06-02: 500 mg via INTRAVENOUS
  Filled 2017-06-02: qty 550

## 2017-06-02 MED ORDER — HYDROMORPHONE HCL 1 MG/ML IJ SOLN: 0.2500 mg | INTRAMUSCULAR | Status: DC | PRN

## 2017-06-02 MED ORDER — ONDANSETRON HCL 4 MG/2ML IJ SOLN
INTRAMUSCULAR | Status: AC
  Filled 2017-06-02: qty 2

## 2017-06-02 MED ORDER — BUPIVACAINE HCL (PF) 0.25 % IJ SOLN
INTRAMUSCULAR | Status: DC | PRN
  Administered 2017-06-02: 30 mL

## 2017-06-02 MED ORDER — ONDANSETRON HCL 4 MG/2ML IJ SOLN
INTRAMUSCULAR | Status: DC | PRN
  Administered 2017-06-02: 4 mg via INTRAVENOUS

## 2017-06-02 MED ORDER — LORATADINE 10 MG PO TABS
10.0000 mg | ORAL_TABLET | Freq: Every evening | ORAL | Status: DC
  Filled 2017-06-02: qty 1

## 2017-06-02 MED ORDER — POLYETHYLENE GLYCOL 3350 17 G PO PACK: 17.0000 g | PACK | Freq: Every day | ORAL | Status: DC | PRN

## 2017-06-02 MED ORDER — BUPIVACAINE HCL (PF) 0.25 % IJ SOLN
INTRAMUSCULAR | Status: AC
  Filled 2017-06-02: qty 30

## 2017-06-02 MED ORDER — DOCUSATE SODIUM 100 MG PO CAPS
100.0000 mg | ORAL_CAPSULE | Freq: Two times a day (BID) | ORAL | Status: DC
  Administered 2017-06-02 – 2017-06-03 (×2): 100 mg via ORAL
  Filled 2017-06-02 (×2): qty 1

## 2017-06-02 MED ORDER — PHENYLEPHRINE 40 MCG/ML (10ML) SYRINGE FOR IV PUSH (FOR BLOOD PRESSURE SUPPORT)
PREFILLED_SYRINGE | INTRAVENOUS | Status: AC
  Filled 2017-06-02: qty 10

## 2017-06-02 MED ORDER — TRANEXAMIC ACID 1000 MG/10ML IV SOLN
1000.0000 mg | Freq: Once | INTRAVENOUS | Status: AC
  Administered 2017-06-02: 1000 mg via INTRAVENOUS
  Filled 2017-06-02: qty 1100

## 2017-06-02 SURGICAL SUPPLY — 37 items
BAG DECANTER FOR FLEXI CONT (MISCELLANEOUS) IMPLANT
BAG ZIPLOCK 12X15 (MISCELLANEOUS) IMPLANT
BLADE EXTENDED COATED 6.5IN (ELECTRODE) ×3 IMPLANT
BLADE SAG 18X100X1.27 (BLADE) ×3 IMPLANT
CAPT HIP TOTAL 2 ×3 IMPLANT
CLOSURE WOUND 1/2 X4 (GAUZE/BANDAGES/DRESSINGS) ×1
CLOTH BEACON ORANGE TIMEOUT ST (SAFETY) IMPLANT
COVER PERINEAL POST (MISCELLANEOUS) ×3 IMPLANT
COVER SURGICAL LIGHT HANDLE (MISCELLANEOUS) ×3 IMPLANT
DECANTER SPIKE VIAL GLASS SM (MISCELLANEOUS) ×3 IMPLANT
DRAPE STERI IOBAN 125X83 (DRAPES) ×3 IMPLANT
DRAPE U-SHAPE 47X51 STRL (DRAPES) ×6 IMPLANT
DRSG ADAPTIC 3X8 NADH LF (GAUZE/BANDAGES/DRESSINGS) ×3 IMPLANT
DRSG MEPILEX BORDER 4X4 (GAUZE/BANDAGES/DRESSINGS) ×3 IMPLANT
DRSG MEPILEX BORDER 4X8 (GAUZE/BANDAGES/DRESSINGS) ×3 IMPLANT
DURAPREP 26ML APPLICATOR (WOUND CARE) ×3 IMPLANT
ELECT REM PT RETURN 15FT ADLT (MISCELLANEOUS) ×3 IMPLANT
EVACUATOR 1/8 PVC DRAIN (DRAIN) ×3 IMPLANT
GLOVE BIO SURGEON STRL SZ8 (GLOVE) ×3 IMPLANT
GLOVE BIOGEL PI IND STRL 6.5 (GLOVE) ×7 IMPLANT
GLOVE BIOGEL PI IND STRL 8 (GLOVE) ×1 IMPLANT
GLOVE BIOGEL PI INDICATOR 6.5 (GLOVE) ×14
GLOVE BIOGEL PI INDICATOR 8 (GLOVE) ×2
GLOVE SURG SS PI 6.5 STRL IVOR (GLOVE) ×3 IMPLANT
GOWN STRL REUS W/TWL LRG LVL3 (GOWN DISPOSABLE) ×9 IMPLANT
GOWN STRL REUS W/TWL XL LVL3 (GOWN DISPOSABLE) ×6 IMPLANT
PACK ANTERIOR HIP CUSTOM (KITS) ×3 IMPLANT
STRIP CLOSURE SKIN 1/2X4 (GAUZE/BANDAGES/DRESSINGS) ×2 IMPLANT
SUT ETHIBOND NAB CT1 #1 30IN (SUTURE) ×3 IMPLANT
SUT MNCRL AB 4-0 PS2 18 (SUTURE) ×3 IMPLANT
SUT STRATAFIX 0 PDS 27 VIOLET (SUTURE) ×3
SUT VIC AB 2-0 CT1 27 (SUTURE) ×4
SUT VIC AB 2-0 CT1 TAPERPNT 27 (SUTURE) ×2 IMPLANT
SUTURE STRATFX 0 PDS 27 VIOLET (SUTURE) ×1 IMPLANT
SYR 50ML LL SCALE MARK (SYRINGE) IMPLANT
TRAY FOLEY CATH 14FR (SET/KITS/TRAYS/PACK) ×3 IMPLANT
YANKAUER SUCT BULB TIP 10FT TU (MISCELLANEOUS) ×3 IMPLANT

## 2017-06-02 NOTE — Anesthesia Procedure Notes (Signed)
Spinal  Patient location during procedure: OR Start time: 06/02/2017 7:07 AM End time: 06/02/2017 7:12 AM Reason for block: at surgeon's request Staffing Resident/CRNA: West Pugh, CRNA Performed: resident/CRNA  Preanesthetic Checklist Completed: patient identified, site marked, surgical consent, pre-op evaluation, timeout performed, IV checked, risks and benefits discussed and monitors and equipment checked Spinal Block Patient position: sitting Prep: DuraPrep Patient monitoring: heart rate, continuous pulse ox and blood pressure Approach: right paramedian Location: L3-4 Injection technique: single-shot Needle Needle type: Pencan and Introducer  Needle gauge: 24 G Needle length: 9 cm Assessment Sensory level: T4 Additional Notes Expiration of kit checked and confirmed. Patient tolerated procedure well,without complications x 1 attempt with noted clear CSF. Loss of motor and sensory on exam post injection.

## 2017-06-02 NOTE — Anesthesia Procedure Notes (Signed)
Procedure Name: MAC Date/Time: 06/02/2017 7:04 AM Performed by: West Pugh, CRNA Pre-anesthesia Checklist: Patient identified, Emergency Drugs available, Suction available, Patient being monitored and Timeout performed Patient Re-evaluated:Patient Re-evaluated prior to induction Oxygen Delivery Method: Simple face mask Induction Type: IV induction Placement Confirmation: positive ETCO2 Dental Injury: Teeth and Oropharynx as per pre-operative assessment

## 2017-06-02 NOTE — Interval H&P Note (Signed)
History and Physical Interval Note:  06/02/2017 6:36 AM  Tammy Garner  has presented today for surgery, with the diagnosis of Right hip osteoarthritis  The various methods of treatment have been discussed with the patient and family. After consideration of risks, benefits and other options for treatment, the patient has consented to  Procedure(s): RIGHT TOTAL HIP ARTHROPLASTY ANTERIOR APPROACH (Right) as a surgical intervention .  The patient's history has been reviewed, patient examined, no change in status, stable for surgery.  I have reviewed the patient's chart and labs.  Questions were answered to the patient's satisfaction.     Pilar Plate Luke Rigsbee

## 2017-06-02 NOTE — Transfer of Care (Signed)
Immediate Anesthesia Transfer of Care Note  Patient: Tammy Garner  Procedure(s) Performed: RIGHT TOTAL HIP ARTHROPLASTY ANTERIOR APPROACH (Right Hip)  Patient Location: PACU  Anesthesia Type:Spinal and MAC  Level of Consciousness: awake, alert , oriented and patient cooperative  Airway & Oxygen Therapy: Patient Spontanous Breathing and Patient connected to face mask oxygen  Post-op Assessment: Report given to RN and Post -op Vital signs reviewed and stable  Post vital signs: Reviewed and stable  Last Vitals:  Vitals Value Taken Time  BP 125/65 06/02/2017  8:43 AM  Temp    Pulse 70 06/02/2017  8:45 AM  Resp 21 06/02/2017  8:45 AM  SpO2 100 % 06/02/2017  8:45 AM  Vitals shown include unvalidated device data.  Last Pain:  Vitals:   06/02/17 0602  TempSrc:   PainSc: 0-No pain      Patients Stated Pain Goal: 3 (15/94/58 [id number])  Complications: No apparent anesthesia complications

## 2017-06-02 NOTE — Evaluation (Signed)
Physical Therapy Evaluation Patient Details Name: Tammy Garner MRN: [id number] DOB: [birth year] Today's Date: 06/02/2017   History of Present Illness  Pt s/p R THR   Clinical Impression  Pt s/p R THR and presents with decreased R LE strength/ROM and post op pain limiting functional mobility.  Pt should progress well to dc home with family assist.    Follow Up Recommendations Follow surgeon's recommendation for DC plan and follow-up therapies    Equipment Recommendations  None recommended by PT    Recommendations for Other Services       Precautions / Restrictions Precautions Precautions: Fall Restrictions Weight Bearing Restrictions: No Other Position/Activity Restrictions: WBAT      Mobility  Bed Mobility Overal bed mobility: Needs Assistance Bed Mobility: Supine to Sit     Supine to sit: Min assist     General bed mobility comments: Cues for sequence and use of L LE to self assist  Transfers Overall transfer level: Needs assistance Equipment used: Rolling walker (2 wheeled) Transfers: Sit to/from Stand Sit to Stand: Min assist         General transfer comment: cues for LE management and use of UEs to self assist  Ambulation/Gait Ambulation/Gait assistance: Min assist Ambulation Distance (Feet): 14 Feet Assistive device: Rolling walker (2 wheeled) Gait Pattern/deviations: Step-to pattern;Decreased step length - right;Decreased step length - left;Shuffle;Trunk flexed Gait velocity: decr   General Gait Details: cues for sequence, posture and position from [employer]            Wheelchair Mobility    Modified Rankin (Stroke Patients Only)       Balance Overall balance assessment: No apparent balance deficits (not formally assessed)                                           Pertinent Vitals/Pain Pain Assessment: 0-10 Pain Score: 4  Pain Location: R hip Pain Descriptors / Indicators: Aching;Sore Pain  Intervention(s): Limited activity within patient's tolerance;Monitored during session;Premedicated before session;Ice applied    Home Living Family/patient expects to be discharged to:: Private residence Living Arrangements: Spouse/significant other Available Help at Discharge: Family Type of Home: House Home Access: Stairs to enter Entrance Stairs-Rails: Right Entrance Stairs-Number of Steps: 6 Home Layout: Two level Home Equipment: Environmental consultant - 2 wheels;Cane - single point;Bedside commode;Shower seat      Prior Function Level of Independence: Independent;Independent with assistive device(s)               Hand Dominance        Extremity/Trunk Assessment   Upper Extremity Assessment Upper Extremity Assessment: Overall WFL for tasks assessed    Lower Extremity Assessment Lower Extremity Assessment: RLE deficits/detail       Communication   Communication: No difficulties  Cognition Arousal/Alertness: Awake/alert Behavior During Therapy: WFL for tasks assessed/performed Overall Cognitive Status: Within Functional Limits for tasks assessed                                        General Comments      Exercises Total Joint Exercises Ankle Circles/Pumps: AROM;Both;15 reps;Supine   Assessment/Plan    PT Assessment Patient needs continued PT services  PT Problem List Decreased strength;Decreased range of motion;Decreased activity tolerance;Decreased coordination;Decreased knowledge of use of DME;Pain  PT Treatment Interventions DME instruction;Gait training;Stair training;Functional mobility training;Therapeutic activities;Therapeutic exercise;Patient/family education    PT Goals (Current goals can be found in the Care Plan section)  Acute Rehab PT Goals Patient Stated Goal: Regain IND PT Goal Formulation: With patient Time For Goal Achievement: 06/09/17 Potential to Achieve Goals: Good    Frequency 7X/week   Barriers to discharge         Co-evaluation               AM-PAC PT "6 Clicks" Daily Activity  Outcome Measure Difficulty turning over in bed (including adjusting bedclothes, sheets and blankets)?: Unable Difficulty moving from lying on back to sitting on the side of the bed? : Unable Difficulty sitting down on and standing up from a chair with arms (e.g., wheelchair, bedside commode, etc,.)?: Unable Help needed moving to and from a bed to chair (including a wheelchair)?: A Little Help needed walking in hospital room?: A Little Help needed climbing 3-5 steps with a railing? : A Lot 6 Click Score: 11    End of Session Equipment Utilized During Treatment: Gait belt Activity Tolerance: Patient tolerated treatment well Patient left: in chair;with call bell/phone within reach;with chair alarm set Nurse Communication: Mobility status PT Visit Diagnosis: Difficulty in walking, not elsewhere classified (R26.2)    Time: 9381-0175 PT Time Calculation (min) (ACUTE ONLY): 30 min   Charges:   PT Evaluation $PT Eval Low Complexity: 1 Low PT Treatments $Gait Training: 8-22 mins   PT G Codes:        Pg [phone]   Jens Siems 06/02/2017, 5:11 PM

## 2017-06-02 NOTE — Anesthesia Postprocedure Evaluation (Signed)
Anesthesia Post Note  Patient: Tammy Garner  Procedure(s) Performed: RIGHT TOTAL HIP ARTHROPLASTY ANTERIOR APPROACH (Right Hip)     Patient location during evaluation: PACU Anesthesia Type: Spinal Level of consciousness: oriented and awake and alert Pain management: pain level controlled Vital Signs Assessment: post-procedure vital signs reviewed and stable Respiratory status: spontaneous breathing and respiratory function stable Cardiovascular status: blood pressure returned to baseline and stable Postop Assessment: no headache, no backache, no apparent nausea or vomiting, patient able to bend at knees and spinal receding Anesthetic complications: no    Last Vitals:  Vitals:   06/02/17 0930 06/02/17 0940  BP: 132/85 127/85  Pulse: 70 72  Resp: 12 17  Temp:  36.5 C  SpO2: 99% 98%    Last Pain:  Vitals:   06/02/17 0930  TempSrc:   PainSc: 0-No pain                 Azari Janssens,W. EDMOND

## 2017-06-02 NOTE — Op Note (Signed)
OPERATIVE REPORT- TOTAL HIP ARTHROPLASTY   PREOPERATIVE DIAGNOSIS: Osteoarthritis of the Right hip.   POSTOPERATIVE DIAGNOSIS: Osteoarthritis of the Right  hip.   PROCEDURE: Right total hip arthroplasty, anterior approach.   SURGEON: Gaynelle Arabian, MD   ASSISTANT: Ardeen Jourdain, PA-C  ANESTHESIA:  Spinal  ESTIMATED BLOOD LOSS:-400 mL    DRAINS: Hemovac x1.   COMPLICATIONS: None   CONDITION: PACU - hemodynamically stable.   BRIEF CLINICAL NOTE: Tammy Garner is a 52 y.o. female who has advanced end-  stage arthritis of their Left  hip with progressively worsening pain and  dysfunction.The patient has failed nonoperative management and presents for  total hip arthroplasty.   PROCEDURE IN DETAIL: After successful administration of spinal  anesthetic, the traction boots for the [facility] bed were placed on both  feet and the patient was placed onto the [facility] bed, boots placed into the leg  holders. The Left hip was then isolated from the perineum with plastic  drapes and prepped and draped in the usual sterile fashion. ASIS and  greater trochanter were marked and a oblique incision was made, starting  at about 1 cm lateral and 2 cm distal to the ASIS and coursing towards  the anterior cortex of the femur. The skin was cut with a 10 blade  through subcutaneous tissue to the level of the fascia overlying the  tensor fascia lata muscle. The fascia was then incised in line with the  incision at the junction of the anterior third and posterior 2/3rd. The  muscle was teased off the fascia and then the interval between the TFL  and the rectus was developed. The Hohmann retractor was then placed at  the top of the femoral neck over the capsule. The vessels overlying the  capsule were cauterized and the fat on top of the capsule was removed.  A Hohmann retractor was then placed anterior underneath the rectus  femoris to give exposure to the entire anterior capsule. A  T-shaped  capsulotomy was performed. The edges were tagged and the femoral head  was identified.       Osteophytes are removed off the superior acetabulum.  The femoral neck was then cut in situ with an oscillating saw. Traction  was then applied to the left lower extremity utilizing the [facility]  traction. The femoral head was then removed. Retractors were placed  around the acetabulum and then circumferential removal of the labrum was  performed. Osteophytes were also removed. Reaming starts at 47 mm to  medialize and  Increased in 2 mm increments to 49 mm. We reamed in  approximately 40 degrees of abduction, 20 degrees anteversion. A 50 mm  pinnacle acetabular shell was then impacted in anatomic position under  fluoroscopic guidance with excellent purchase. We did not need to place  any additional dome screws. A 32 mm neutral + 4 marathon liner was then  placed into the acetabular shell.       The femoral lift was then placed along the lateral aspect of the femur  just distal to the vastus ridge. The leg was  externally rotated and capsule  was stripped off the inferior aspect of the femoral neck down to the  level of the lesser trochanter, this was done with electrocautery. The femur was lifted after this was performed. The  leg was then placed in an extended and adducted position essentially delivering the femur. We also removed the capsule superiorly and the piriformis from the piriformis  fossa to gain excellent exposure of the  proximal femur. Rongeur was used to remove some cancellous bone to get  into the lateral portion of the proximal femur for placement of the  initial starter reamer. The starter broaches was placed  the starter broach  and was shown to go down the center of the canal. Broaching  with the  Actis system was then performed starting at size 8, coursing  Up to size 3. A size 3 had excellent torsional and rotational  and axial stability. The trial high offset neck was  then placed  with a 32 + 5 trial head. The hip was then reduced. We confirmed that  the stem was in the canal both on AP and lateral x-rays. It also has excellent sizing. The hip was reduced with outstanding stability through full extension and full external rotation.. AP pelvis was taken and the leg lengths were measured and found to be equal. Hip was then dislocated again and the femoral head and neck removed. The  femoral broach was removed. Size 3 Actis stem with a high offset  neck was then impacted into the femur following native anteversion. Has  excellent purchase in the canal. Excellent torsional and rotational and  axial stability. It is confirmed to be in the canal on AP and lateral  fluoroscopic views. The 32 + 5 ceramic head was placed and the hip  reduced with outstanding stability. Again AP pelvis was taken and it  confirmed that the leg lengths were equal. The wound was then copiously  irrigated with saline solution and the capsule reattached and repaired  with Ethibond suture. 30 ml of .25% Bupivicaine was  injected into the capsule and into the edge of the tensor fascia lata as well as subcutaneous tissue. The fascia overlying the tensor fascia lata was then closed with a running #1 V-Loc. Subcu was closed with interrupted 2-0 Vicryl and subcuticular running 4-0 Monocryl. Incision was cleaned  and dried. Steri-Strips and a bulky sterile dressing applied. Hemovac  drain was hooked to suction and then the patient was awakened and transported to  recovery in stable condition.        Please note that a surgical assistant was a medical necessity for this procedure to perform it in a safe and expeditious manner. Assistant was necessary to provide appropriate retraction of vital neurovascular structures and to prevent femoral fracture and allow for anatomic placement of the prosthesis.  Gaynelle Arabian, M.D.

## 2017-06-02 NOTE — Addendum Note (Signed)
Addendum  created 06/02/17 1650 by West Pugh, CRNA   Intraprocedure Event edited

## 2017-06-02 NOTE — Discharge Instructions (Addendum)
Dr. Gaynelle Arabian Total Joint Specialist [facility] [address] [place], [place] [address] [phone]  ANTERIOR APPROACH TOTAL HIP REPLACEMENT POSTOPERATIVE DIRECTIONS   Hip Rehabilitation, Guidelines Following Surgery  The results of a hip operation are greatly improved after range of motion and muscle strengthening exercises. Follow all safety measures which are given to protect your hip. If any of these exercises cause increased pain or swelling in your joint, decrease the amount until you are comfortable again. Then slowly increase the exercises. Call your caregiver if you have problems or questions.   HOME CARE INSTRUCTIONS  Remove items at home which could result in a fall. This includes throw rugs or furniture in walking pathways.   ICE to the affected hip every three hours for 30 minutes at a time and then as needed for pain and swelling.  Continue to use ice on the hip for pain and swelling from surgery. You may notice swelling that will progress down to the foot and ankle.  This is normal after surgery.  Elevate the leg when you are not up walking on it.    Continue to use the breathing machine which will help keep your temperature down.  It is common for your temperature to cycle up and down following surgery, especially at night when you are not up moving around and exerting yourself.  The breathing machine keeps your lungs expanded and your temperature down.   DIET You may resume your previous home diet once your are discharged from the hospital.  DRESSING / WOUND CARE / SHOWERING You may change your dressing every day with sterile gauze.  Please use good hand washing techniques before changing the dressing.  Do not use any lotions or creams on the incision until instructed by your surgeon. You may start showering once you are discharged home but do not submerge the incision under water. Just pat the incision dry and apply a dry gauze dressing on  daily. Change the surgical dressing daily and reapply a dry dressing each time.  ACTIVITY Walk with your walker as instructed. Use walker as long as suggested by your caregivers. Avoid periods of inactivity such as sitting longer than an hour when not asleep. This helps prevent blood clots.  You may resume a sexual relationship in one month or when given the OK by your doctor.  You may return to work once you are cleared by your doctor.  Do not drive a car for 6 weeks or until released by you surgeon.  Do not drive while taking narcotics.  WEIGHT BEARING Weight bearing as tolerated with assist device (walker, cane, etc) as directed, use it as long as suggested by your surgeon or therapist, typically at least 4-6 weeks.  POSTOPERATIVE CONSTIPATION PROTOCOL Constipation - defined medically as fewer than three stools per week and severe constipation as less than one stool per week.  One of the most common issues patients have following surgery is constipation.  Even if you have a regular bowel pattern at home, your normal regimen is likely to be disrupted due to multiple reasons following surgery.  Combination of anesthesia, postoperative narcotics, change in appetite and fluid intake all can affect your bowels.  In order to avoid complications following surgery, here are some recommendations in order to help you during your recovery period.  Colace (docusate) - Pick up an over-the-counter form of Colace or another stool softener and take twice a day as long as you are requiring postoperative  pain medications.  Take with a full glass of water daily.  If you experience loose stools or diarrhea, hold the colace until you stool forms back up.  If your symptoms do not get better within 1 week or if they get worse, check with your doctor.  Dulcolax (bisacodyl) - Pick up over-the-counter and take as directed by the product packaging as needed to assist with the movement of your bowels.  Take with a full  glass of water.  Use this product as needed if not relieved by Colace only.   MiraLax (polyethylene glycol) - Pick up over-the-counter to have on hand.  MiraLax is a solution that will increase the amount of water in your bowels to assist with bowel movements.  Take as directed and can mix with a glass of water, juice, soda, coffee, or tea.  Take if you go more than two days without a movement. Do not use MiraLax more than once per day. Call your doctor if you are still constipated or irregular after using this medication for 7 days in a row.  If you continue to have problems with postoperative constipation, please contact the office for further assistance and recommendations.  If you experience "the worst abdominal pain ever" or develop nausea or vomiting, please contact the office immediatly for further recommendations for treatment.  ITCHING  If you experience itching with your medications, try taking only a single pain pill, or even half a pain pill at a time.  You can also use Benadryl over the counter for itching or also to help with sleep.   TED HOSE STOCKINGS Wear the elastic stockings on both legs for three weeks following surgery during the day but you may remove then at night for sleeping.  MEDICATIONS See your medication summary on the After Visit Summary that the nursing staff will review with you prior to discharge.  You may have some home medications which will be placed on hold until you complete the course of blood thinner medication.  It is important for you to complete the blood thinner medication as prescribed by your surgeon.  Continue your approved medications as instructed at time of discharge.  PRECAUTIONS If you experience chest pain or shortness of breath - call 911 immediately for transfer to the hospital emergency department.  If you develop a fever greater that 101 F, purulent drainage from wound, increased redness or drainage from wound, foul odor from the  wound/dressing, or calf pain - CONTACT YOUR SURGEON.                                                   FOLLOW-UP APPOINTMENTS Make sure you keep all of your appointments after your operation with your surgeon and caregivers. You should call the office at the above phone number and make an appointment for approximately two weeks after the date of your surgery or on the date instructed by your surgeon outlined in the "After Visit Summary".  RANGE OF MOTION AND STRENGTHENING EXERCISES  These exercises are designed to help you keep full movement of your hip joint. Follow your caregiver's or physical therapist's instructions. Perform all exercises about fifteen times, three times per day or as directed. Exercise both hips, even if you have had only one joint replacement. These exercises can be done on a training (exercise) mat, on the floor,  on a table or on a bed. Use whatever works the best and is most comfortable for you. Use music or television while you are exercising so that the exercises are a pleasant break in your day. This will make your life better with the exercises acting as a break in routine you can look forward to.  Lying on your back, slowly slide your foot toward your buttocks, raising your knee up off the floor. Then slowly slide your foot back down until your leg is straight again.  Lying on your back spread your legs as far apart as you can without causing discomfort.  Lying on your side, raise your upper leg and foot straight up from the floor as far as is comfortable. Slowly lower the leg and repeat.  Lying on your back, tighten up the muscle in the front of your thigh (quadriceps muscles). You can do this by keeping your leg straight and trying to raise your heel off the floor. This helps strengthen the largest muscle supporting your knee.  Lying on your back, tighten up the muscles of your buttocks both with the legs straight and with the knee bent at a comfortable angle while keeping  your heel on the floor.   IF YOU ARE TRANSFERRED TO A SKILLED REHAB FACILITY If the patient is transferred to a skilled rehab facility following release from the hospital, a list of the current medications will be sent to the facility for the patient to continue.  When discharged from the skilled rehab facility, please have the facility set up the patient's [place] prior to being released. Also, the skilled facility will be responsible for providing the patient with their medications at time of release from the facility to include their pain medication, the muscle relaxants, and their blood thinner medication. If the patient is still at the rehab facility at time of the two week follow up appointment, the skilled rehab facility will also need to assist the patient in arranging follow up appointment in our office and any transportation needs.  MAKE SURE YOU:  Understand these instructions.  Get help right away if you are not doing well or get worse.    Pick up stool softner and laxative for home use following surgery while on pain medications. Do not submerge incision under water. Please use good hand washing techniques while changing dressing each day. May shower starting three days after surgery. Please use a clean towel to pat the incision dry following showers. Continue to use ice for pain and swelling after surgery. Do not use any lotions or creams on the incision until instructed by your surgeon.  Information on my medicine - XARELTO (Rivaroxaban)  This medication education was reviewed with me or my healthcare representative as part of my discharge preparation.    Why was Xarelto prescribed for you? Xarelto was prescribed for you to reduce the risk of blood clots forming after orthopedic surgery. The medical term for these abnormal blood clots is venous thromboembolism (VTE).  What do you need to know about xarelto ? Take your Xarelto ONCE DAILY at the same  time every day. You may take it either with or without food.  If you have difficulty swallowing the tablet whole, you may crush it and mix in applesauce just prior to taking your dose.  Take Xarelto exactly as prescribed by your doctor and DO NOT stop taking Xarelto without talking to the doctor who prescribed the medication.  Stopping without other VTE  Stopping without other VTE prevention medication to take the place of Xarelto® may increase your risk of developing a clot. ° °After discharge, you should have regular check-up appointments with your healthcare provider that is prescribing your Xarelto®.   ° °What do you do if you miss a dose? °If you miss a dose, take it as soon as you remember on the same day then continue your regularly scheduled once daily regimen the next day. Do not take two doses of Xarelto® on the same day.  ° °Important Safety Information °A possible side effect of Xarelto® is bleeding. You should call your healthcare provider right away if you experience any of the following: °? Bleeding from an injury or your nose that does not stop. °? Unusual colored urine (red or dark brown) or unusual colored stools (red or black). °? Unusual bruising for unknown reasons. °? A serious fall or if you hit your head (even if there is no bleeding). ° °Some medicines may interact with Xarelto® and might increase your risk of bleeding while on Xarelto®. To help avoid this, consult your healthcare provider or pharmacist prior to using any new prescription or non-prescription medications, including herbals, vitamins, non-steroidal anti-inflammatory drugs (NSAIDs) and supplements. ° °This website has more information on Xarelto®: [email or web address].com. ° ° ° °

## 2017-06-03 LAB — BASIC METABOLIC PANEL
Anion gap: 8 (ref 5–15)
CALCIUM: 8.5 mg/dL — AB (ref 8.9–10.3)
CO2: 25 mmol/L (ref 22–32)
CREATININE: 0.42 mg/dL — AB (ref 0.44–1.00)
Chloride: 104 mmol/L (ref 101–111)
GFR calc non Af Amer: >60 mL/min (ref >60)
Glucose, Bld: 126 mg/dL — ABNORMAL HIGH (ref 65–99)
Potassium: 3.9 mmol/L (ref 3.5–5.1)
SODIUM: 137 mmol/L (ref 135–145)

## 2017-06-03 LAB — CBC
HCT: 34 % — ABNORMAL LOW (ref 36.0–46.0)
Hemoglobin: 11.5 g/dL — ABNORMAL LOW (ref 12.0–15.0)
MCH: 32.1 pg (ref 26.0–34.0)
MCHC: 33.8 g/dL (ref 30.0–36.0)
MCV: 95 fL (ref 78.0–100.0)
PLATELETS: 212 10*3/uL (ref 150–400)
RBC: 3.58 MIL/uL — ABNORMAL LOW (ref 3.87–5.11)
RDW: 12.5 % (ref 11.5–15.5)
WBC: 16 10*3/uL — ABNORMAL HIGH (ref 4.0–10.5)

## 2017-06-03 MED ORDER — TRAMADOL HCL 50 MG PO TABS: 50.0000 mg | ORAL_TABLET | Freq: Four times a day (QID) | ORAL | Status: DC | PRN

## 2017-06-03 MED ORDER — METHOCARBAMOL 500 MG PO TABS: 500.0000 mg | ORAL_TABLET | Freq: Four times a day (QID) | ORAL | 0 refills | Status: DC | PRN

## 2017-06-03 MED ORDER — TRAMADOL HCL 50 MG PO TABS: 50.0000 mg | ORAL_TABLET | Freq: Four times a day (QID) | ORAL | 0 refills | Status: DC | PRN

## 2017-06-03 MED ORDER — OXYCODONE HCL 5 MG PO TABS: 5.0000 mg | ORAL_TABLET | Freq: Four times a day (QID) | ORAL | 0 refills | Status: DC | PRN

## 2017-06-03 MED ORDER — RIVAROXABAN 10 MG PO TABS: 10.0000 mg | ORAL_TABLET | Freq: Every day | ORAL | 0 refills | Status: DC

## 2017-06-03 MED ORDER — ONDANSETRON HCL 4 MG PO TABS: 4.0000 mg | ORAL_TABLET | Freq: Four times a day (QID) | ORAL | 0 refills | Status: DC | PRN

## 2017-06-03 NOTE — Discharge Summary (Signed)
Physician Discharge Summary   Patient ID: Tammy Garner MRN: [id number] DOB/AGE: Jan 19, 1965 52 y.o.  Admit date: 06/02/2017 Discharge date:   Primary Diagnosis: Primary osteoarthritis right hip  Admission Diagnoses:  Past Medical History:  Diagnosis Date  . Allergy   . Asthma   . Dysmenorrhea 10/04/2013  . Environmental allergies   . Family history of adverse reaction to anesthesia    mom had facial and neck swelling with TAH 35 yrs ago; pt has had no prob  . Fibroid 10/16/2013  . Ovarian cyst 10/16/2013   Discharge Diagnoses:   Principal Problem:   OA (osteoarthritis) of hip  Estimated body mass index is 28.62 kg/m as calculated from the following:   Height as of this encounter: _0  (1.727 m).   Weight as of this encounter: 85.4 kg (188 lb 4 oz).  Procedure(s) (LRB): RIGHT TOTAL HIP ARTHROPLASTY ANTERIOR APPROACH (Right)   Consults: None  HPI: Tammy Garner, 52 y.o. female, has a history of pain and functional disability in the right hip(s) due to arthritis and patient has failed non-surgical conservative treatments for greater than 12 weeks to include NSAID's and/or analgesics, corticosteriod injections, flexibility and strengthening excercises and activity modification.  Onset of symptoms was gradual starting 3 years ago with gradually worsening course since that time.The patient noted no past surgery on the right hip(s).  Patient currently rates pain in the right hip at 8 out of 10 with activity. Patient has night pain, worsening of pain with activity and weight bearing, pain that interfers with activities of daily living and pain with passive range of motion. Patient has evidence of subchondral cysts, periarticular osteophytes and joint space narrowing by imaging studies. This condition presents safety issues increasing the risk of falls. There is no current active infection.    Laboratory Data: Admission on 06/02/2017  Component Date Value Ref Range  Status  . WBC 06/03/2017 16.0* 4.0 - 10.5 K/uL Final  . RBC 06/03/2017 3.58* 3.87 - 5.11 MIL/uL Final  . Hemoglobin 06/03/2017 11.5* 12.0 - 15.0 g/dL Final  . HCT 06/03/2017 34.0* 36.0 - 46.0 % Final  . MCV 06/03/2017 95.0  78.0 - 100.0 fL Final  . MCH 06/03/2017 32.1  26.0 - 34.0 pg Final  . MCHC 06/03/2017 33.8  30.0 - 36.0 g/dL Final  . RDW 06/03/2017 12.5  11.5 - 15.5 % Final  . Platelets 06/03/2017 212  150 - 400 K/uL Final   Performed at [facility], [place] [address], [place], [place] [address]  . Sodium 06/03/2017 137  135 - 145 mmol/L Final  . Potassium 06/03/2017 3.9  3.5 - 5.1 mmol/L Final  . Chloride 06/03/2017 104  101 - 111 mmol/L Final  . CO2 06/03/2017 25  22 - 32 mmol/L Final  . Glucose, Bld 06/03/2017 126* 65 - 99 mg/dL Final  . BUN 06/03/2017 <5* 6 - 20 mg/dL Final  . Creatinine, Ser 06/03/2017 0.42* 0.44 - 1.00 mg/dL Final  . Calcium 06/03/2017 8.5* 8.9 - 10.3 mg/dL Final  . GFR calc non Af Amer 06/03/2017 >60  >60 mL/min Final  . GFR calc Af Amer 06/03/2017 >60  >60 mL/min Final   Comment: (NOTE) The eGFR has been calculated using the CKD EPI equation. This calculation has not been validated in all clinical situations. eGFR's persistently <60 mL/min signify possible Chronic Kidney Disease.   Georgiann Hahn gap 06/03/2017 8  5 - 15 Final   Performed at [facility], [place]  [address], [place], [place] [address]  Hospital Outpatient Visit on 05/25/2017  Component Date Value Ref Range Status  . aPTT 05/25/2017 43* 24 - 36 seconds Final   Comment:        IF BASELINE aPTT IS ELEVATED, SUGGEST PATIENT RISK ASSESSMENT BE USED TO DETERMINE APPROPRIATE ANTICOAGULANT THERAPY. Performed at [facility], [place] [address], [place], [place] [address]   . WBC 05/25/2017 11.4* 4.0 - 10.5 K/uL Final  . RBC 05/25/2017 4.83  3.87 - 5.11 MIL/uL Final  . Hemoglobin 05/25/2017 15.8* 12.0 - 15.0 g/dL Final  . HCT 05/25/2017 45.5  36.0  - 46.0 % Final  . MCV 05/25/2017 94.2  78.0 - 100.0 fL Final  . MCH 05/25/2017 32.7  26.0 - 34.0 pg Final  . MCHC 05/25/2017 34.7  30.0 - 36.0 g/dL Final  . RDW 05/25/2017 12.9  11.5 - 15.5 % Final  . Platelets 05/25/2017 274  150 - 400 K/uL Final   Performed at [facility], [place] [address], [place], [place] [address]  . Sodium 05/25/2017 135  135 - 145 mmol/L Final  . Potassium 05/25/2017 4.4  3.5 - 5.1 mmol/L Final  . Chloride 05/25/2017 102  101 - 111 mmol/L Final  . CO2 05/25/2017 24  22 - 32 mmol/L Final  . Glucose, Bld 05/25/2017 93  65 - 99 mg/dL Final  . BUN 05/25/2017 9  6 - 20 mg/dL Final  . Creatinine, Ser 05/25/2017 0.62  0.44 - 1.00 mg/dL Final  . Calcium 05/25/2017 9.2  8.9 - 10.3 mg/dL Final  . Total Protein 05/25/2017 7.9  6.5 - 8.1 g/dL Final  . Albumin 05/25/2017 4.8  3.5 - 5.0 g/dL Final  . AST 05/25/2017 23  15 - 41 U/L Final  . ALT 05/25/2017 14  14 - 54 U/L Final  . Alkaline Phosphatase 05/25/2017 58  38 - 126 U/L Final  . Total Bilirubin 05/25/2017 0.7  0.3 - 1.2 mg/dL Final  . GFR calc non Af Amer 05/25/2017 >60  >60 mL/min Final  . GFR calc Af Amer 05/25/2017 >60  >60 mL/min Final   Comment: (NOTE) The eGFR has been calculated using the CKD EPI equation. This calculation has not been validated in all clinical situations. eGFR's persistently <60 mL/min signify possible Chronic Kidney Disease.   Georgiann Hahn gap 05/25/2017 9  5 - 15 Final   Performed at [facility], [place] [address] [place], [place] [address]  . Prothrombin Time 05/25/2017 12.8  11.4 - 15.2 seconds Final  . INR 05/25/2017 0.97   Final   Performed at [facility], [place] [address] [place], [place] [address]  . ABO/RH(D) 05/25/2017 A POS   Final  . Antibody Screen 05/25/2017 NEG   Final  . Sample Expiration 05/25/2017 06/05/2017   Final  . Extend sample reason 05/25/2017    Final                   Value:NO TRANSFUSIONS OR PREGNANCY IN THE  PAST 3 MONTHS Performed at [facility], [place] [address], [place], [place] [address]   . MRSA, PCR 05/25/2017 INVALID RESULTS, SPECIMEN SENT FOR CULTURE* NEGATIVE Final  . Staphylococcus aureus 05/25/2017 INVALID RESULTS, SPECIMEN SENT FOR CULTURE* NEGATIVE Final   Performed at [place] [address], [place],  [address]  . ABO/RH(D) 05/25/2017    Final  Value:A POS Performed at [facility], [place] [address], [place], [place] [address]   . Specimen Description 05/25/2017    Final                   Value:NOSE Performed at [facility], [place] [address] [place], [place] [address]   . Special Requests 05/25/2017    Final                   Value:NONE Performed at [facility], [place] [address], [place], [place] [address]   . Culture 05/25/2017    Final                   Value:NO MRSA DETECTED Performed at [facility], [place] [address], [place], [place] [address]   . Report Status 05/25/2017 05/26/2017 FINAL   Final     X-Rays:Dg Pelvis Portable  Result Date: 06/02/2017 CLINICAL DATA:  Postop right total hip replacement EXAM: PORTABLE PELVIS 1-2 VIEWS COMPARISON:  None. FINDINGS: Changes of right hip replacement. Normal AP alignment. Soft tissue drain in place. No hardware bony complicating feature. IMPRESSION: Right hip replacement.  No visible complicating feature. Electronically Signed   By: Rolm Baptise M.D.   On: 06/02/2017 09:22   Dg C-arm 1-60 Min-no Report  Result Date: 06/02/2017 Fluoroscopy was utilized by the requesting physician.  No radiographic interpretation.    EKG: Orders placed or performed in visit on 12/09/10  . EKG 12-Lead     Hospital Course: Patient was admitted to [facility] and taken to the OR and underwent the above state procedure without complications.  Patient tolerated the procedure well and was later transferred to the  recovery room and then to the orthopaedic floor for postoperative care.  They were given PO and IV analgesics for pain control following their surgery.  They were given 24 hours of postoperative antibiotics of  Anti-infectives (From admission, onward)   Start     Dose/Rate Route Frequency Ordered Stop   06/02/17 1830  vancomycin (VANCOCIN) IVPB 1000 mg/200 mL premix     1,000 mg 200 mL/hr over 60 Minutes Intravenous Every 12 hours 06/02/17 1011 06/02/17 1819   06/02/17 0600  vancomycin (VANCOCIN) IVPB 1000 mg/200 mL premix     1,000 mg 200 mL/hr over 60 Minutes Intravenous On call to O.R. 06/02/17 0543 06/02/17 0733     and started on DVT prophylaxis in the form of Xarelto.   PT and OT were ordered for total hip protocol.  The patient was allowed to be WBAT with therapy. Discharge planning was consulted to help with postop disposition and equipment needs.  Patient had a fair night on the evening of surgery.  They started to get up OOB with therapy on day one.  Hemovac drain was pulled without difficulty. The patient had progressed with therapy and meeting their goals.  Patient was seen in rounds and was ready to go home.  Diet: Cardiac diet Activity:WBAT Follow-up:in 2 weeks Disposition - Home Discharged Condition: stable   Discharge Instructions    Call MD / Call 911   Complete by:  As directed    If you experience chest pain or shortness of breath, CALL 911 and be transported to the hospital emergency room.  If you develope a fever above 101 F, pus (white drainage) or increased drainage or redness at the wound, or calf pain, call your surgeon's office.   Constipation Prevention  Complete by:  As directed    Drink plenty of fluids.  Prune juice may be helpful.  You may use a stool softener, such as Colace (over the counter) 100 mg twice a day.  Use MiraLax (over the counter) for constipation as needed.   Diet - low sodium heart healthy   Complete by:  As directed    Discharge  instructions   Complete by:  As directed    Dr. Gaynelle Arabian Total Joint Specialist [facility] [address], [place], [place] [address] [phone]  ANTERIOR APPROACH TOTAL HIP REPLACEMENT POSTOPERATIVE DIRECTIONS   Hip Rehabilitation, Guidelines Following Surgery  The results of a hip operation are greatly improved after range of motion and muscle strengthening exercises. Follow all safety measures which are given to protect your hip. If any of these exercises cause increased pain or swelling in your joint, decrease the amount until you are comfortable again. Then slowly increase the exercises. Call your caregiver if you have problems or questions.   HOME CARE INSTRUCTIONS  Remove items at home which could result in a fall. This includes throw rugs or furniture in walking pathways.  ICE to the affected hip every three hours for 30 minutes at a time and then as needed for pain and swelling.  Continue to use ice on the hip for pain and swelling from surgery. You may notice swelling that will progress down to the foot and ankle.  This is normal after surgery.  Elevate the leg when you are not up walking on it.   Continue to use the breathing machine which will help keep your temperature down.  It is common for your temperature to cycle up and down following surgery, especially at night when you are not up moving around and exerting yourself.  The breathing machine keeps your lungs expanded and your temperature down.   DIET You may resume your previous home diet once your are discharged from the hospital.  DRESSING / WOUND CARE / SHOWERING You may change your dressing every day with sterile gauze.  Please use good hand washing techniques before changing the dressing.  Do not use any lotions or creams on the incision until instructed by your surgeon. You may start showering once you are discharged home but do not submerge the incision under water. Just pat the incision dry and  apply a dry gauze dressing on daily. Change the surgical dressing daily and reapply a dry dressing each time.  ACTIVITY Walk with your walker as instructed. Use walker as long as suggested by your caregivers. Avoid periods of inactivity such as sitting longer than an hour when not asleep. This helps prevent blood clots.  You may resume a sexual relationship in one month or when given the OK by your doctor.  You may return to work once you are cleared by your doctor.  Do not drive a car for 6 weeks or until released by you surgeon.  Do not drive while taking narcotics.  WEIGHT BEARING Weight bearing as tolerated with assist device (walker, cane, etc) as directed, use it as long as suggested by your surgeon or therapist, typically at least 4-6 weeks.  POSTOPERATIVE CONSTIPATION PROTOCOL Constipation - defined medically as fewer than three stools per week and severe constipation as less than one stool per week.  One of the most common issues patients have following surgery is constipation.  Even if you have a regular bowel pattern at home, your normal regimen is likely to  be disrupted due to multiple reasons following surgery.  Combination of anesthesia, postoperative narcotics, change in appetite and fluid intake all can affect your bowels.  In order to avoid complications following surgery, here are some recommendations in order to help you during your recovery period.  Colace (docusate) - Pick up an over-the-counter form of Colace or another stool softener and take twice a day as long as you are requiring postoperative pain medications.  Take with a full glass of water daily.  If you experience loose stools or diarrhea, hold the colace until you stool forms back up.  If your symptoms do not get better within 1 week or if they get worse, check with your doctor.  Dulcolax (bisacodyl) - Pick up over-the-counter and take as directed by the product packaging as needed to assist with the movement of  your bowels.  Take with a full glass of water.  Use this product as needed if not relieved by Colace only.   MiraLax (polyethylene glycol) - Pick up over-the-counter to have on hand.  MiraLax is a solution that will increase the amount of water in your bowels to assist with bowel movements.  Take as directed and can mix with a glass of water, juice, soda, coffee, or tea.  Take if you go more than two days without a movement. Do not use MiraLax more than once per day. Call your doctor if you are still constipated or irregular after using this medication for 7 days in a row.  If you continue to have problems with postoperative constipation, please contact the office for further assistance and recommendations.  If you experience "the worst abdominal pain ever" or develop nausea or vomiting, please contact the office immediatly for further recommendations for treatment.  ITCHING  If you experience itching with your medications, try taking only a single pain pill, or even half a pain pill at a time.  You can also use Benadryl over the counter for itching or also to help with sleep.   TED HOSE STOCKINGS Wear the elastic stockings on both legs for three weeks following surgery during the day but you may remove then at night for sleeping.  MEDICATIONS See your medication summary on the "After Visit Summary" that the nursing staff will review with you prior to discharge.  You may have some home medications which will be placed on hold until you complete the course of blood thinner medication.  It is important for you to complete the blood thinner medication as prescribed by your surgeon.  Continue your approved medications as instructed at time of discharge.  PRECAUTIONS If you experience chest pain or shortness of breath - call 911 immediately for transfer to the hospital emergency department.  If you develop a fever greater that 101 F, purulent drainage from wound, increased redness or drainage from  wound, foul odor from the wound/dressing, or calf pain - CONTACT YOUR SURGEON.                                                   FOLLOW-UP APPOINTMENTS Make sure you keep all of your appointments after your operation with your surgeon and caregivers. You should call the office at the above phone number and make an appointment for approximately two weeks after the date of your surgery or on the date instructed by your surgeon  outlined in the "After Visit Summary".  RANGE OF MOTION AND STRENGTHENING EXERCISES  These exercises are designed to help you keep full movement of your hip joint. Follow your caregiver's or physical therapist's instructions. Perform all exercises about fifteen times, three times per day or as directed. Exercise both hips, even if you have had only one joint replacement. These exercises can be done on a training (exercise) mat, on the floor, on a table or on a bed. Use whatever works the best and is most comfortable for you. Use music or television while you are exercising so that the exercises are a pleasant break in your day. This will make your life better with the exercises acting as a break in routine you can look forward to.  Lying on your back, slowly slide your foot toward your buttocks, raising your knee up off the floor. Then slowly slide your foot back down until your leg is straight again.  Lying on your back spread your legs as far apart as you can without causing discomfort.  Lying on your side, raise your upper leg and foot straight up from the floor as far as is comfortable. Slowly lower the leg and repeat.  Lying on your back, tighten up the muscle in the front of your thigh (quadriceps muscles). You can do this by keeping your leg straight and trying to raise your heel off the floor. This helps strengthen the largest muscle supporting your knee.  Lying on your back, tighten up the muscles of your buttocks both with the legs straight and with the knee bent at a  comfortable angle while keeping your heel on the floor.   IF YOU ARE TRANSFERRED TO A SKILLED REHAB FACILITY If the patient is transferred to a skilled rehab facility following release from the hospital, a list of the current medications will be sent to the facility for the patient to continue.  When discharged from the skilled rehab facility, please have the facility set up the patient's [place] prior to being released. Also, the skilled facility will be responsible for providing the patient with their medications at time of release from the facility to include their pain medication, the muscle relaxants, and their blood thinner medication. If the patient is still at the rehab facility at time of the two week follow up appointment, the skilled rehab facility will also need to assist the patient in arranging follow up appointment in our office and any transportation needs.  MAKE SURE YOU:  Understand these instructions.  Get help right away if you are not doing well or get worse.    Pick up stool softner and laxative for home use following surgery while on pain medications. Do not submerge incision under water. Please use good hand washing techniques while changing dressing each day. May shower starting three days after surgery. Please use a clean towel to pat the incision dry following showers. Continue to use ice for pain and swelling after surgery. Do not use any lotions or creams on the incision until instructed by your surgeon.  Information on my medicine - XARELTO (Rivaroxaban)  This medication education was reviewed with me or my healthcare representative as part of my discharge preparation.    Why was Xarelto prescribed for you? Xarelto was prescribed for you to reduce the risk of blood clots forming after orthopedic surgery. The medical term for these abnormal blood clots is venous thromboembolism (VTE).  What do you need to know about xarelto ?  Take your  Xarelto ONCE DAILY at the same time every day. You may take it either with or without food.  If you have difficulty swallowing the tablet whole, you may crush it and mix in applesauce just prior to taking your dose.  Take Xarelto exactly as prescribed by your doctor and DO NOT stop taking Xarelto without talking to the doctor who prescribed the medication.  Stopping without other VTE prevention medication to take the place of Xarelto may increase your risk of developing a clot.  After discharge, you should have regular check-up appointments with your healthcare provider that is prescribing your Xarelto.    What do you do if you miss a dose? If you miss a dose, take it as soon as you remember on the same day then continue your regularly scheduled once daily regimen the next day. Do not take two doses of Xarelto on the same day.   Important Safety Information A possible side effect of Xarelto is bleeding. You should call your healthcare provider right away if you experience any of the following: Bleeding from an injury or your nose that does not stop. Unusual colored urine (red or dark brown) or unusual colored stools (red or black). Unusual bruising for unknown reasons. A serious fall or if you hit your head (even if there is no bleeding).  Some medicines may interact with Xarelto and might increase your risk of bleeding while on Xarelto. To help avoid this, consult your healthcare provider or pharmacist prior to using any new prescription or non-prescription medications, including herbals, vitamins, non-steroidal anti-inflammatory drugs (NSAIDs) and supplements.  This website has more information on Xarelto: [email or web address]   Increase activity slowly as tolerated   Complete by:  As directed      Allergies as of 06/03/2017      Reactions   Augmentin [amoxicillin-pot Clavulanate]    GI upset Has patient had a PCN reaction causing immediate rash, facial/tongue/throat swelling,  SOB or lightheadedness with hypotension: Yes Has patient had a PCN reaction causing severe rash involving mucus membranes or skin necrosis: No Has patient had a PCN reaction that required hospitalization: No Has patient had a PCN reaction occurring within the last 10 years: No If all of the above answers are "NO", then may proceed with Cephalosporin use.      Medication List    STOP taking these medications   ibuprofen 200 MG tablet Commonly known as:  ADVIL,MOTRIN   multivitamin with minerals Tabs tablet     TAKE these medications   acetaminophen 500 MG tablet Commonly known as:  TYLENOL Take 1,000 mg by mouth every 6 (six) hours as needed (for pain.).   cetirizine 10 MG tablet Commonly known as:  ZYRTEC Take 10 mg by mouth every evening. Reported on 02/22/2015   fluticasone 50 MCG/ACT nasal spray Commonly known as:  FLONASE Place 1 spray into both nostrils every evening.   methocarbamol 500 MG tablet Commonly known as:  ROBAXIN Take 1 tablet (500 mg total) by mouth every 6 (six) hours as needed for muscle spasms.   ondansetron 4 MG tablet Commonly known as:  ZOFRAN Take 1 tablet (4 mg total) by mouth every 6 (six) hours as needed for nausea.   oxyCODONE 5 MG immediate release tablet Commonly known as:  Oxy IR/ROXICODONE Take 1-2 tablets (5-10 mg total) by mouth every 6 (six) hours as needed for moderate pain (pain score 4-6).   rivaroxaban 10 MG Tabs tablet Commonly known as:  XARELTO Take  1 tablet (10 mg total) by mouth daily with breakfast.   traMADol 50 MG tablet Commonly known as:  ULTRAM Take 1-2 tablets (50-100 mg total) by mouth every 6 (six) hours as needed for moderate pain.      Follow-up Information    Gaynelle Arabian, MD. Schedule an appointment as soon as possible for a visit on 06/15/2017.   Specialty:  Orthopedic Surgery Contact information: [address] [place] [address] [phone]           Signed: Ardeen Jourdain, PA-C Orthopaedic Surgery 06/03/2017, 12:59 PM

## 2017-06-03 NOTE — Progress Notes (Addendum)
   Subjective: 1 Day Post-Op Procedure(s) (LRB): RIGHT TOTAL HIP ARTHROPLASTY ANTERIOR APPROACH (Right) Patient reports pain as mild.   Patient seen in rounds with Dr. Wynelle Link. Patient is well, and has had no acute complaints or problems. Voiding well. Positive flatus. No issues overnight. Walked with therapy yesterday.    Objective: Vital signs in last 24 hours: Temp:  [97.4 F (36.3 C)-98.7 F (37.1 C)] 98.7 F (37.1 C) (05/30 0617) Pulse Rate:  [57-86] 86 (05/30 0617) Resp:  [12-21] 17 (05/30 0617) BP: (125-154)/(59-85) 148/73 (05/30 0617) SpO2:  [98 %-100 %] 99 % (05/30 0617)  Intake/Output from previous day:  Intake/Output Summary (Last 24 hours) at 06/03/2017 0719 Last data filed at 06/03/2017 0631 Gross per 24 hour  Intake 4567.5 ml  Output 5905 ml  Net -1337.5 ml      Labs: Recent Labs    06/03/17 0554  HGB 11.5*   Recent Labs    06/03/17 0554  WBC 16.0*  RBC 3.58*  HCT 34.0*  PLT 212   Recent Labs    06/03/17 0554  NA 137  K 3.9  CL 104  CO2 25  BUN <5*  CREATININE 0.42*  GLUCOSE 126*  CALCIUM 8.5*    EXAM General - Patient is Alert and Oriented Extremity - Neurologically intact Intact pulses distally Dorsiflexion/Plantar flexion intact Compartment soft Dressing - dressing C/D/I Motor Function - intact, moving foot and toes well on exam.  Hemovac pulled without difficulty.  Past Medical History:  Diagnosis Date  . Allergy   . Asthma   . Dysmenorrhea 10/04/2013  . Environmental allergies   . Family history of adverse reaction to anesthesia    mom had facial and neck swelling with TAH 35 yrs ago; pt has had no prob  . Fibroid 10/16/2013  . Ovarian cyst 10/16/2013    Assessment/Plan: 1 Day Post-Op Procedure(s) (LRB): RIGHT TOTAL HIP ARTHROPLASTY ANTERIOR APPROACH (Right) Principal Problem:   OA (osteoarthritis) of hip  Estimated body mass index is 28.62 kg/m as calculated from the following:   Height as of this encounter: 5'  8" (1.727 m).   Weight as of this encounter: 85.4 kg (188 lb 4 oz). Advance diet Up with therapy D/C IV fluids when tolerating POs well  DVT Prophylaxis - Xarelto Weight Bearing As Tolerated Hemovac Pulled   Will continue to work with therapy this morning. Plan for DC home today if she is meeting goals. Follow up in office in 2 weeks.   Ardeen Jourdain, PA-C Orthopaedic Surgery 06/03/2017, 7:19 AM

## 2017-06-03 NOTE — Progress Notes (Signed)
Physical Therapy Treatment Patient Details Name: Tammy Garner MRN: [id number] DOB: 05/20/1965 Today's Date: 06/03/2017    History of Present Illness Pt s/p R THR     PT Comments    Pt motivated and progressing well.  Spouse present to review stairs, car transfers and home therex program with progression and written instruction provided.   Follow Up Recommendations  Follow surgeon's recommendation for DC plan and follow-up therapies     Equipment Recommendations  None recommended by PT    Recommendations for Other Services       Precautions / Restrictions Precautions Precautions: Fall Restrictions Weight Bearing Restrictions: No Other Position/Activity Restrictions: WBAT    Mobility  Bed Mobility Overal bed mobility: Needs Assistance Bed Mobility: Supine to Sit;Sit to Supine     Supine to sit: Min guard;Supervision Sit to supine: Min guard;Supervision   General bed mobility comments: Cues for sequence and use of L LE to self assist  Transfers Overall transfer level: Needs assistance Equipment used: Rolling walker (2 wheeled) Transfers: Sit to/from Stand Sit to Stand: Supervision         General transfer comment: cues for LE management and use of UEs to self assist  Ambulation/Gait Ambulation/Gait assistance: Min guard;Supervision Ambulation Distance (Feet): 200 Feet Assistive device: Rolling walker (2 wheeled) Gait Pattern/deviations: Decreased step length - right;Decreased step length - left;Shuffle;Trunk flexed;Step-to pattern;Step-through pattern Gait velocity: decr   General Gait Details: cues for sequence, posture and position from [employer] Mobility    Modified Rankin (Stroke Patients Only)       Balance Overall balance assessment: No apparent balance deficits (not formally assessed)                                          Cognition Arousal/Alertness: Awake/alert Behavior During  Therapy: WFL for tasks assessed/performed Overall Cognitive Status: Within Functional Limits for tasks assessed                                        Exercises Total Joint Exercises Ankle Circles/Pumps: AROM;Both;15 reps;Supine Quad Sets: AROM;Both;10 reps;Supine Heel Slides: AAROM;Right;20 reps;Supine Hip ABduction/ADduction: AAROM;Right;15 reps;Supine Long Arc Quad: AROM;Right;10 reps;Seated    General Comments        Pertinent Vitals/Pain Pain Assessment: 0-10 Pain Score: 3  Pain Location: R hip Pain Descriptors / Indicators: Aching;Sore;Tightness Pain Intervention(s): Limited activity within patient's tolerance;Monitored during session;Premedicated before session    Home Living                      Prior Function            PT Goals (current goals can now be found in the care plan section) Acute Rehab PT Goals Patient Stated Goal: Regain IND PT Goal Formulation: With patient Time For Goal Achievement: 06/09/17 Potential to Achieve Goals: Good Progress towards PT goals: Progressing toward goals    Frequency    7X/week      PT Plan Current plan remains appropriate    Co-evaluation              AM-PAC PT "6 Clicks" Daily Activity  Outcome Measure  Difficulty turning over in bed (including adjusting bedclothes, sheets and  blankets)?: A Lot Difficulty moving from lying on back to sitting on the side of the bed? : A Lot Difficulty sitting down on and standing up from a chair with arms (e.g., wheelchair, bedside commode, etc,.)?: A Lot Help needed moving to and from a bed to chair (including a wheelchair)?: A Little Help needed walking in hospital room?: A Little Help needed climbing 3-5 steps with a railing? : A Little 6 Click Score: 15    End of Session Equipment Utilized During Treatment: Gait belt Activity Tolerance: Patient tolerated treatment well Patient left: in chair;with call bell/phone within reach;with chair  alarm set Nurse Communication: Mobility status PT Visit Diagnosis: Difficulty in walking, not elsewhere classified (R26.2)     Time: 8984-2103 PT Time Calculation (min) (ACUTE ONLY): 49 min  Charges:  $Gait Training: 8-22 mins $Therapeutic Exercise: 8-22 mins $Therapeutic Activity: 8-22 mins                    G Codes:       Pg [phone]    Tammy Garner 06/03/2017, 2:34 PM

## 2017-06-03 NOTE — Progress Notes (Signed)
Physical Therapy Treatment Patient Details Name: Tammy Garner MRN: [id number] DOB: 22-Mar-1965 Today's Date: 06/03/2017    History of Present Illness Pt s/p R THR     PT Comments    Pt progressing steadily with mobility and with no c/o dizziness this am.  Pt hopeful for return home this pm.   Follow Up Recommendations  Follow surgeon's recommendation for DC plan and follow-up therapies     Equipment Recommendations  None recommended by PT    Recommendations for Other Services       Precautions / Restrictions Precautions Precautions: Fall Restrictions Weight Bearing Restrictions: No Other Position/Activity Restrictions: WBAT    Mobility  Bed Mobility Overal bed mobility: Needs Assistance Bed Mobility: Supine to Sit     Supine to sit: Min assist     General bed mobility comments: Cues for sequence and use of L LE to self assist  Transfers Overall transfer level: Needs assistance Equipment used: Rolling walker (2 wheeled) Transfers: Sit to/from Stand Sit to Stand: Min guard         General transfer comment: cues for LE management and use of UEs to self assist  Ambulation/Gait Ambulation/Gait assistance: Min assist;Min guard Ambulation Distance (Feet): 65 Feet Assistive device: Rolling walker (2 wheeled) Gait Pattern/deviations: Step-to pattern;Decreased step length - right;Decreased step length - left;Shuffle;Trunk flexed Gait velocity: decr   General Gait Details: cues for sequence, posture and position from [employer]             Wheelchair Mobility    Modified Rankin (Stroke Patients Only)       Balance Overall balance assessment: No apparent balance deficits (not formally assessed)                                          Cognition Arousal/Alertness: Awake/alert Behavior During Therapy: WFL for tasks assessed/performed Overall Cognitive Status: Within Functional Limits for tasks assessed                                         Exercises Total Joint Exercises Ankle Circles/Pumps: AROM;Both;15 reps;Supine Quad Sets: AROM;Both;10 reps;Supine Heel Slides: AAROM;Right;20 reps;Supine Hip ABduction/ADduction: AAROM;Right;15 reps;Supine    General Comments        Pertinent Vitals/Pain Pain Assessment: 0-10 Pain Score: 4  Pain Location: R hip Pain Descriptors / Indicators: Aching;Sore;Tightness Pain Intervention(s): Limited activity within patient's tolerance;Monitored during session;Premedicated before session;Ice applied    Home Living                      Prior Function            PT Goals (current goals can now be found in the care plan section) Acute Rehab PT Goals Patient Stated Goal: Regain IND PT Goal Formulation: With patient Time For Goal Achievement: 06/09/17 Potential to Achieve Goals: Good Progress towards PT goals: Progressing toward goals    Frequency    7X/week      PT Plan Current plan remains appropriate    Co-evaluation              AM-PAC PT "6 Clicks" Daily Activity  Outcome Measure  Difficulty turning over in bed (including adjusting bedclothes, sheets and blankets)?: Unable Difficulty moving from lying on back to sitting on  the side of the bed? : Unable Difficulty sitting down on and standing up from a chair with arms (e.g., wheelchair, bedside commode, etc,.)?: Unable Help needed moving to and from a bed to chair (including a wheelchair)?: A Little Help needed walking in hospital room?: A Little Help needed climbing 3-5 steps with a railing? : A Little 6 Click Score: 12    End of Session Equipment Utilized During Treatment: Gait belt Activity Tolerance: Patient tolerated treatment well Patient left: in chair;with call bell/phone within reach;with chair alarm set Nurse Communication: Mobility status PT Visit Diagnosis: Difficulty in walking, not elsewhere classified (R26.2)     Time: 7282-0601 PT Time  Calculation (min) (ACUTE ONLY): 27 min  Charges:  $Gait Training: 8-22 mins $Therapeutic Exercise: 8-22 mins                    G Codes:       Pg [phone]    Monick Rena 06/03/2017, 11:16 AM

## 2017-06-03 NOTE — Progress Notes (Addendum)
Spoke with patient at bedside. Confirmed plan for HEP, already arranged. Has RW and 3n1. [phone] 

## 2018-11-25 ENCOUNTER — Other Ambulatory Visit: Payer: Self-pay

## 2018-11-25 DIAGNOSIS — Z20822 Contact with and (suspected) exposure to covid-19: Secondary | ICD-10-CM

## 2018-11-28 LAB — NOVEL CORONAVIRUS, NAA: SARS-CoV-2, NAA: NOT DETECTED

## 2019-02-08 ENCOUNTER — Encounter: Payer: Self-pay | Admitting: Family Medicine

## 2019-03-13 ENCOUNTER — Ambulatory Visit (INDEPENDENT_AMBULATORY_CARE_PROVIDER_SITE_OTHER): Payer: 59 | Admitting: Family Medicine

## 2019-03-13 ENCOUNTER — Other Ambulatory Visit: Payer: Self-pay

## 2019-03-13 DIAGNOSIS — J4520 Mild intermittent asthma, uncomplicated: Secondary | ICD-10-CM

## 2019-03-13 MED ORDER — ALBUTEROL SULFATE HFA 108 (90 BASE) MCG/ACT IN AERS: INHALATION_SPRAY | RESPIRATORY_TRACT | 0 refills | Status: DC

## 2019-03-13 NOTE — Progress Notes (Signed)
   Subjective:  Audio only  Patient ID: Tammy Garner, female    DOB: May 01, 1965, 54 y.o.   MRN: [id number]  HPIpt states she has struggled with allergies for years. Would like to get rx for an inhaler incase she has an allergic reaction incase she has a reaction to her covid vaccine. She has not scheduled her vaccine yet.   Virtual Visit via Telephone Note  I connected with Tammy Garner on 03/13/19 at  2:30 PM EST by telephone and verified that I am speaking with the correct person using two identifiers.  Location: Patient: home Provider: office   I discussed the limitations, risks, security and privacy concerns of performing an evaluation and management service by telephone and the availability of in person appointments. I also discussed with the patient that there may be a patient responsible charge related to this service. The patient expressed understanding and agreed to proceed.   History of Present Illness:    Observations/Objective:   Assessment and Plan:   Follow Up Instructions:    I discussed the assessment and treatment plan with the patient. The patient was provided an opportunity to ask questions and all were answered. The patient agreed with the plan and demonstrated an understanding of the instructions.   The patient was advised to call back or seek an in-person evaluation if the symptoms worsen or if the condition fails to improve as anticipated.  I provided 22 minutes of non-face-to-face time during this encounter.   Patient has history of reactive airways.  Has not used an inhaler for a while.  Concerned about Covid vaccine coming up shortly.  She is worried she could have a wheezy reaction.  sHe would like to have an inhaler to have on him.  Would also like to have inhaler on hand in case she gets sick in the future  History of cigarette smoking in the past.  Allergic rhinitis has started to act up so patient has started back on Zyrtec and  Flonase and definitely help     Review of Systems Negative    Objective:   Physical Exam  Virtual      Assessment & Plan:  Impression reactive airway history.  Options discussed.  Will prescribe inhaler.  Patient depressed on vaccinations.  Encouraged in this regard

## 2019-03-31 ENCOUNTER — Ambulatory Visit: Payer: 59 | Attending: Internal Medicine

## 2019-03-31 DIAGNOSIS — Z23 Encounter for immunization: Secondary | ICD-10-CM

## 2019-03-31 NOTE — Progress Notes (Signed)
   Covid-19 Vaccination Clinic  Name:  Tammy Garner    MRN: [id number] DOB: 1965/05/16  03/31/2019  Tammy Garner was observed post Covid-19 immunization for 15 minutes without incident. She was provided with Vaccine Information Sheet and instruction to access the V-Safe system.   Tammy Garner was instructed to call 911 with any severe reactions post vaccine: Marland Kitchen Difficulty breathing  . Swelling of face and throat  . A fast heartbeat  . A bad rash all over body  . Dizziness and weakness   Immunizations Administered    Name Date Dose VIS Date Route   Moderna COVID-19 Vaccine 03/31/2019 11:41 AM 0.5 mL 12/06/2018 Intramuscular   Manufacturer: Moderna   Lot: [id number]   [id number]

## 2019-05-02 ENCOUNTER — Ambulatory Visit: Payer: 59 | Attending: Internal Medicine

## 2019-05-02 DIAGNOSIS — Z23 Encounter for immunization: Secondary | ICD-10-CM

## 2019-05-02 NOTE — Progress Notes (Signed)
   Covid-19 Vaccination Clinic  Name:  Tammy Garner    MRN: [id number] DOB: 1965-10-14  05/02/2019  [name] was observed post Covid-19 immunization for 15 minutes without incident. She was provided with Vaccine Information Sheet and instruction to access the V-Safe system.   [name] was instructed to call 911 with any severe reactions post vaccine: Marland Kitchen Difficulty breathing  . Swelling of face and throat  . A fast heartbeat  . A bad rash all over body  . Dizziness and weakness   Immunizations Administered    Name Date Dose VIS Date Route   Moderna COVID-19 Vaccine 05/02/2019 11:55 AM 0.5 mL 12/2018 Intramuscular   Manufacturer: Moderna   Lot: [id number]   [id number]

## 2019-09-22 ENCOUNTER — Other Ambulatory Visit: Payer: Self-pay

## 2019-09-22 ENCOUNTER — Encounter: Payer: Self-pay | Admitting: Nurse Practitioner

## 2019-09-22 ENCOUNTER — Ambulatory Visit (INDEPENDENT_AMBULATORY_CARE_PROVIDER_SITE_OTHER): Payer: 59 | Admitting: Nurse Practitioner

## 2019-09-22 VITALS — BP 118/86 | HR 84 | Temp 97.8°F | Ht 68.0 in | Wt 197.0 lb

## 2019-09-22 DIAGNOSIS — Z13220 Encounter for screening for lipoid disorders: Secondary | ICD-10-CM | POA: Diagnosis not present

## 2019-09-22 DIAGNOSIS — Z1231 Encounter for screening mammogram for malignant neoplasm of breast: Secondary | ICD-10-CM | POA: Diagnosis not present

## 2019-09-22 DIAGNOSIS — Z0000 Encounter for general adult medical examination without abnormal findings: Secondary | ICD-10-CM | POA: Diagnosis not present

## 2019-09-22 DIAGNOSIS — Z124 Encounter for screening for malignant neoplasm of cervix: Secondary | ICD-10-CM

## 2019-09-22 DIAGNOSIS — Z1151 Encounter for screening for human papillomavirus (HPV): Secondary | ICD-10-CM

## 2019-09-22 DIAGNOSIS — Z23 Encounter for immunization: Secondary | ICD-10-CM

## 2019-09-22 DIAGNOSIS — Z1211 Encounter for screening for malignant neoplasm of colon: Secondary | ICD-10-CM

## 2019-09-22 DIAGNOSIS — Z01419 Encounter for gynecological examination (general) (routine) without abnormal findings: Secondary | ICD-10-CM

## 2019-09-22 NOTE — Progress Notes (Signed)
   Subjective:    Patient ID: Tammy Garner, female    DOB: 27-Sep-1965, 54 y.o.   MRN: [id number]  HPI The patient comes in today for a wellness visit.    A review of their health history was completed.  A review of medications was also completed.  Any needed refills; none  Eating habits: health conscious  Falls/  MVA accidents in past few months: none  Regular exercise: walking and yoga  Specialist pt sees on regular basis: Dr. Pilar Plate Aluisio - hip replacement and Dr. Glo Herring - gyn  Preventative health issues were discussed.   Additional concerns: none     Review of Systems     Objective:   Physical Exam        Assessment & Plan:

## 2019-09-22 NOTE — Progress Notes (Signed)
Subjective:    Subjective   Patient ID: Tammy Garner, female    DOB: 1965-08-06, 54 y.o.   MRN: [id number]  HPI The patient comes in today for a wellness visit and annual physical. She reports seasonal allergies alleviated by Flonase and Zyrtec. She denies Ventolin use but did have it refilled for emergency need while receiving her COVID vaccine. She reports some left hip and lower back arthritis and is followed by orthopedics for this. She is s/p right hip arthroplasty in 2019. She reports smoking cessation at that same time. She confirms regular eye and dental exams and has received both COVID vaccinations.    A review of their health history was completed.  S/p supracervical hysterectomy. Same sexual partner.  A review of medications was also completed.  Any needed refills; none  Eating habits: health conscious mostly: fruits, vegetables and grains with water. Occassional  soft drinks. 3-5 beers twice a week.  Falls/  MVA accidents in past few months: none  Regular exercise: walking and yoga 3-5x per week  Specialist pt sees on regular basis: Dr. Pilar Plate Aluisio - hip replacement and Dr. Glo Herring - gyn  Preventative health issues were discussed.   Additional concerns:  None    Review of Systems Review of Systems  Constitutional: Negative for chills, fever, malaise/fatigue and weight loss.       Weight Gain  HENT: Negative for congestion, hearing loss and sinus pain.        Sinus pressure alleviated with Flonase and Zyrtec  Eyes: Positive for discharge. Negative for blurred vision and pain.       Itchy, watery eyes  Respiratory: Negative for cough, sputum production, shortness of breath and wheezing.   Cardiovascular: Negative for chest pain and palpitations.  Gastrointestinal: Negative for abdominal pain, blood in stool, constipation, diarrhea, heartburn, nausea and vomiting.  Genitourinary: Negative for dysuria, flank pain, frequency and hematuria.         Denies enuresis.   Musculoskeletal: Negative for back pain, falls and joint pain.  Skin: Negative for rash.  Neurological: Negative for dizziness, weakness and headaches.  Psychiatric/Behavioral: Negative for depression and suicidal ideas. The patient is not nervous/anxious.    Depression screen [facility] 2/9 09/22/2019 03/15/2017  Decreased Interest 0 0  Down, Depressed, Hopeless 0 0  PHQ - 2 Score 0 0  Altered sleeping - 1  Tired, decreased energy - 1  Change in appetite - 0  Feeling bad or failure about yourself  - 0  Trouble concentrating - 0  Moving slowly or fidgety/restless - 0  Suicidal thoughts - 0  PHQ-9 Score - 2        Objective:   Objective   Physical Exam BP 118/86   Pulse 84   Temp 97.8 F (36.6 C)   Ht 5\' 8"  (1.727 m)   Wt 197 lb (89.4 kg)   LMP 02/06/2015 (Exact Date)   SpO2 100%   BMI 29.95 kg/m    Physical Exam Constitutional:      General: She is not in acute distress.    Appearance: Normal appearance. She is not ill-appearing or toxic-appearing.  HENT:     Right Ear: Tympanic membrane, ear canal and external ear normal.     Left Ear: Tympanic membrane, ear canal and external ear normal.  Eyes:     General:        Right eye: No discharge.        Left eye: No discharge.  Pupils: Pupils are equal, round, and reactive to light.  Neck:     Thyroid: No thyroid mass, thyromegaly or thyroid tenderness.  Cardiovascular:     Rate and Rhythm: Normal rate and regular rhythm.     Pulses: Normal pulses.     Heart sounds: Normal heart sounds. No murmur heard.  No friction rub. No gallop.   Pulmonary:     Effort: Pulmonary effort is normal. No respiratory distress.     Breath sounds: Normal breath sounds. No wheezing or rhonchi.  Chest:     Breasts: Breasts are symmetrical.        Right: Normal. No swelling, bleeding, inverted nipple, mass, nipple discharge, skin change or tenderness.        Left: Normal. No swelling, bleeding, inverted  nipple, mass, nipple discharge, skin change or tenderness.  Abdominal:     General: There is no distension.     Palpations: Abdomen is soft. There is no mass.     Tenderness: There is no abdominal tenderness. There is no guarding.  Genitourinary:    General: Normal vulva.     Vagina: No vaginal discharge.     Comments: External GU: No rashes or lesions noted. Vagina pink. Minimal white mucoid discharge.  Cervix normal in appearance. No CMT.  Bi-Manual Exam: No Tenderness or obvious masses. Exam limited due to abdominal girth.  Musculoskeletal:        General: No swelling or tenderness. Normal range of motion.     Cervical back: Normal range of motion. No rigidity or tenderness.  Lymphadenopathy:     Cervical: No cervical adenopathy.     Upper Body:     Right upper body: No supraclavicular, axillary or pectoral adenopathy.     Left upper body: No supraclavicular, axillary or pectoral adenopathy.  Skin:    General: Skin is warm and dry.     Findings: No lesion or rash.  Neurological:     Mental Status: She is alert and oriented to person, place, and time.  Psychiatric:        Mood and Affect: Mood normal.        Behavior: Behavior normal.        Thought Content: Thought content normal.        Judgment: Judgment normal.          Assessment & Plan:    Problem List Items Addressed This Visit    None    Visit Diagnoses    Routine general medical examination at a health care facility    -  Primary   Relevant Orders   CBC with Differential/Platelet   Comprehensive metabolic panel   Lipid panel   TSH   VITAMIN D 25 Hydroxy (Vit-D Deficiency, Fractures)   IGP, Aptima HPV   Need for vaccination       Relevant Orders   Flu Vaccine QUAD 6+ mos PF IM (Fluarix Quad PF) (Completed)   Encounter for screening mammogram for breast cancer       Relevant Orders   MM 3D SCREEN BREAST BILATERAL   Screening for lipid disorders       Relevant Orders   Lipid panel   Screening  for cervical cancer       Relevant Orders   IGP, Aptima HPV   Screening for HPV (human papillomavirus)       Relevant Orders   IGP, Aptima HPV     Continue with current allergy regimen. Labs pending.  Mammogram and Colonoscopy scheduled  this visit.  Continue with current healthy diet, weight loss efforts and exercise. Limit alcohol consumption for desired weight loss.  Return in about 1 year (around 09/21/2020) for Physical and Labs.

## 2019-09-23 ENCOUNTER — Encounter: Payer: Self-pay | Admitting: Nurse Practitioner

## 2019-09-27 ENCOUNTER — Ambulatory Visit (HOSPITAL_COMMUNITY)
Admission: RE | Admit: 2019-09-27 | Discharge: 2019-09-27 | Disposition: A | Discharge status: Home / Self Care | Payer: 59 | Source: Ambulatory Visit | Attending: Nurse Practitioner | Admitting: Nurse Practitioner

## 2019-09-27 ENCOUNTER — Other Ambulatory Visit: Payer: Self-pay

## 2019-09-27 DIAGNOSIS — Z1231 Encounter for screening mammogram for malignant neoplasm of breast: Secondary | ICD-10-CM | POA: Diagnosis present

## 2019-09-27 LAB — IGP, APTIMA HPV: HPV Aptima: NEGATIVE

## 2019-09-28 ENCOUNTER — Encounter: Payer: Self-pay | Admitting: Family Medicine

## 2019-09-29 LAB — COMPREHENSIVE METABOLIC PANEL
ALT: 13 IU/L (ref 0–32)
AST: 22 IU/L (ref 0–40)
Albumin/Globulin Ratio: 1.7 (ref 1.2–2.2)
Albumin: 4.3 g/dL (ref 3.8–4.9)
Alkaline Phosphatase: 82 IU/L (ref 44–121)
BUN/Creatinine Ratio: 8 — ABNORMAL LOW (ref 9–23)
BUN: 6 mg/dL (ref 6–24)
Bilirubin Total: 0.5 mg/dL (ref 0.0–1.2)
CO2: 24 mmol/L (ref 20–29)
Calcium: 9.6 mg/dL (ref 8.7–10.2)
Chloride: 100 mmol/L (ref 96–106)
Creatinine, Ser: 0.73 mg/dL (ref 0.57–1.00)
GFR calc Af Amer: 110 mL/min/{1.73_m2} (ref >59)
GFR calc non Af Amer: 95 mL/min/{1.73_m2} (ref >59)
Globulin, Total: 2.6 g/dL (ref 1.5–4.5)
Glucose: 86 mg/dL (ref 65–99)
Potassium: 4.9 mmol/L (ref 3.5–5.2)
Sodium: 139 mmol/L (ref 134–144)
Total Protein: 6.9 g/dL (ref 6.0–8.5)

## 2019-09-29 LAB — CBC WITH DIFFERENTIAL/PLATELET
Basophils Absolute: 0.1 10*3/uL (ref 0.0–0.2)
Basos: 1 %
EOS (ABSOLUTE): 0.4 10*3/uL (ref 0.0–0.4)
Eos: 5 %
Hematocrit: 43.1 % (ref 34.0–46.6)
Hemoglobin: 14.8 g/dL (ref 11.1–15.9)
Immature Grans (Abs): 0 10*3/uL (ref 0.0–0.1)
Immature Granulocytes: 0 %
Lymphocytes Absolute: 3.3 10*3/uL — ABNORMAL HIGH (ref 0.7–3.1)
Lymphs: 40 %
MCH: 32.2 pg (ref 26.6–33.0)
MCHC: 34.3 g/dL (ref 31.5–35.7)
MCV: 94 fL (ref 79–97)
Monocytes Absolute: 0.7 10*3/uL (ref 0.1–0.9)
Monocytes: 9 %
Neutrophils Absolute: 3.7 10*3/uL (ref 1.4–7.0)
Neutrophils: 45 %
Platelets: 286 10*3/uL (ref 150–450)
RBC: 4.59 x10E6/uL (ref 3.77–5.28)
RDW: 12.3 % (ref 11.7–15.4)
WBC: 8.1 10*3/uL (ref 3.4–10.8)

## 2019-09-29 LAB — LIPID PANEL
Chol/HDL Ratio: 2.2 ratio (ref 0.0–4.4)
Cholesterol, Total: 185 mg/dL (ref 100–199)
HDL: 84 mg/dL (ref >39)
LDL Chol Calc (NIH): 84 mg/dL (ref 0–99)
Triglycerides: 97 mg/dL (ref 0–149)
VLDL Cholesterol Cal: 17 mg/dL (ref 5–40)

## 2019-09-29 LAB — TSH: TSH: 2.77 u[IU]/mL (ref 0.450–4.500)

## 2019-09-29 LAB — VITAMIN D 25 HYDROXY (VIT D DEFICIENCY, FRACTURES): Vit D, 25-Hydroxy: 22.5 ng/mL — ABNORMAL LOW (ref 30.0–100.0)

## 2019-10-03 ENCOUNTER — Encounter: Payer: Self-pay | Admitting: Internal Medicine

## 2019-10-06 ENCOUNTER — Other Ambulatory Visit: Payer: Self-pay | Admitting: Nurse Practitioner

## 2019-10-06 MED ORDER — VITAMIN D (ERGOCALCIFEROL) 1.25 MG (50000 UNIT) PO CAPS: 50000.0000 [IU] | ORAL_CAPSULE | ORAL | 2 refills | Status: DC

## 2019-11-15 ENCOUNTER — Telehealth: Payer: Self-pay

## 2019-11-15 NOTE — Telephone Encounter (Signed)
Pt called and needs to r/s her apt that scheduled on Monday 11/20/19

## 2019-11-15 NOTE — Telephone Encounter (Signed)
Routing to Angie 

## 2019-11-15 NOTE — Telephone Encounter (Signed)
Pt rescheduled nurse visit for 12/13/2019 at 8:00.

## 2019-11-20 ENCOUNTER — Ambulatory Visit: Payer: 59

## 2019-12-13 ENCOUNTER — Other Ambulatory Visit: Payer: Self-pay

## 2019-12-13 ENCOUNTER — Ambulatory Visit (INDEPENDENT_AMBULATORY_CARE_PROVIDER_SITE_OTHER): Payer: Self-pay | Admitting: *Deleted

## 2019-12-13 VITALS — Ht 68.0 in | Wt 200.8 lb

## 2019-12-13 DIAGNOSIS — Z1211 Encounter for screening for malignant neoplasm of colon: Secondary | ICD-10-CM

## 2019-12-13 NOTE — Patient Instructions (Signed)
[place]   Please notify us immediately if you are diabetic, take iron supplements, or if you are on coumadin or any blood thinners.   Patient Name: Brinklee Cisse Date of procedure: 01/12/2020 Time to register at [place] Stay: 11:45 am Provider: Dr. Abbey Chatters   Purchase: MIRALAX 238 gram bottle, 1 FLEET ENEMA, 1 box of DULCOLAX (All over the counter medications)    01/10/2020- 2 Days prior to procedure: START CLEAR LIQUID DIET AFTER YOUR LUNCH MEAL--NO SOLID FOODS!   01/11/2020- 1 Day prior to procedure:   CLEAR LIQUIDS ALL DAY--NO SOLID FOODS!   Diabetic medication adjustments for today:    At 10:00 AM, take 2 DULCOLAX 37m tablets   At 12:00 PM, Mix 5 teaspoons of Miralax in any 4-6 ounces of CLEAR LIQUIDS (Gatorade) every hour for 5 hours until passing clear, watery stools. Be sure to drink 4 ounces of clear liquid 30 minutes after each dose of Miralax.   At 3:00 PM, take 2 Dulcolax 543mtablets   If stools are not clear & watery by 6:00 PM, take 5 teaspoons of Miralax every 30 minutes until stools are clear (no color)   You must have a complete prep to ensure the most effective cleaning.   Make a conscious effort to drink as much as you can before, during & after the preparation.    Do not take any medications except for your heart, blood pressure & breathing medications the morning of your procedure. You may take them with a sip of clear liquids.    01/12/2020- Day of Procedure  Continue clear liquids until 3 hours before your procedure.  Nothing by mouth after 10:45 am. Give yourself one Fleet enema about 1 hour prior to leaving for the hospital.   Diabetic medication adjustments for today:   You may take TYLENOL products. Please continue your regular medications unless we have instructed you otherwise.    Please note, on the day of your procedure you MUST be accompanied by an adult who is willing to assume responsibility for you at  time of discharge. If you do not have such person with you, your procedure will have to be rescheduled.                                                             Please leave ALL jewelry at home prior to coming to the hospital for your procedure.   *It is your responsibility to check with your insurance company for the benefits of coverage you have for this procedure. Unfortunately, not all insurance companies have benefits to cover all or part of these types of procedures. It is your responsibility to check your benefits, however we will be glad to assist you with any codes your insurance company may need.   Please note that most insurance companies will not cover a screening colonoscopy for people under the age of 5039For example, with some insurance companies you may have benefits for a screening colonoscopy, but if polyps are found the diagnosis will change and then you may have a deductible that will need to be met. Please make sure you check your benefits for screening colonoscopy as well as a diagnostic colonoscopy.    CLEAR LIQUIDS: (NO RED)  Jello Apple Juice White  Grape Juice Water  Banana popsicles Kool-Aid Coffee(No cream or milk)  Tea (No cream or milk) Soft drinks Broth (fat free beef/chicken/vegetable)   Clear liquids allow you to see your fingers on the other side of the glass. Be sure they are NOT RED in color, cloudy, but CLEAR.   Do Not Eat:  Dairy products of any kind Cranberry juice  Tomato or V8 Juice Orange Juice  Grapefruit Juice Red Grape Juice  Solid foods like cereal, oatmeal, yogurt, fruits, vegetables, creamed soups, eggs, bread, etc   HELPFUL HINTS TO MAKE DRINKING EASIER:  -Make sure prep is extremely COLD. Refrigerate the night before. You may also put in freezer.  -You may try mixing Crystal Light or Country Time Lemonade if you prefer. MIx in small amounts. Add more if necessary.  -Trying  drinking through a straw.  -Rinse mouth with water or mouthwash between glasses to remove aftertaste.  -Try sipping on a cold beverage/ice popsicles between glasses of prep.  -Place a piece of sugar-free hard candy in mouth between glasses.  -If you become nauseated, try consuming smaller amounts or stretch out the time between glasses. Stop for 30 minutes to an hour & slowly start back drinking.   Call our office with any questions or concerns at [phone].   Thank You

## 2019-12-13 NOTE — Progress Notes (Addendum)
Gastroenterology Pre-Procedure Review  Request Date: 12/13/2019 Requesting Physician: Dr. Elvia Collum, no previous TCS  PATIENT REVIEW QUESTIONS: The patient responded to the following health history questions as indicated:    1. Diabetes Melitis: no 2. Joint replacements in the past 12 months: no, but hip replacement 2019 3. Major health problems in the past 3 months: no 4. Has an artificial valve or MVP: no 5. Has a defibrillator: no 6. Has been advised in past to take antibiotics in advance of a procedure like teeth cleaning: yes 7. Family history of colon cancer: no  8. Alcohol Use: yes, 2 to 3 beer on weekends 9. Illicit drug Use: no 10. History of sleep apnea: no  11. History of coronary artery or other vascular stents placed within the last 12 months: no 12. History of any prior anesthesia complications: no 13. Body mass index is 30.53 kg/m.    MEDICATIONS & ALLERGIES:    Patient reports the following regarding taking any blood thinners:   Plavix? no Aspirin? no Coumadin? no Brilinta? no Xarelto? no Eliquis? no Pradaxa? no Savaysa? no Effient? no  Patient confirms/reports the following medications:  Current Outpatient Medications  Medication Sig Dispense Refill  . albuterol (VENTOLIN HFA) 108 (90 Base) MCG/ACT inhaler 2 sprays every 4 -6 hours prn wheezing (Patient taking differently: as needed. 2 sprays every 4 -6 hours prn wheezing) 18 g 0  . cetirizine (ZYRTEC) 10 MG tablet Take 10 mg by mouth every evening. Reported on 02/22/2015    . fluticasone (FLONASE) 50 MCG/ACT nasal spray Place 1 spray into both nostrils every evening.    . Vitamin D, Ergocalciferol, (DRISDOL) 1.25 MG (50000 UNIT) CAPS capsule Take 1 capsule (50,000 Units total) by mouth every 7 (seven) days. 4 capsule 2   No current facility-administered medications for this visit.    Patient confirms/reports the following allergies:  Allergies  Allergen Reactions  . Augmentin [Amoxicillin-Pot  Clavulanate]     GI upset Has patient had a PCN reaction causing immediate rash, facial/tongue/throat swelling, SOB or lightheadedness with hypotension: Yes Has patient had a PCN reaction causing severe rash involving mucus membranes or skin necrosis: No Has patient had a PCN reaction that required hospitalization: No Has patient had a PCN reaction occurring within the last 10 years: No If all of the above answers are "NO", then may proceed with Cephalosporin use.     No orders of the defined types were placed in this encounter.   [place] INFORMATION Primary Insurance: [facility],  [place] #: [id number],  Group #: [id number] Pre-Cert / Auth required: Yes, approved online 05/13/8323-04/14/8262 Pre-Cert / Josem Kaufmann #: [id number]  SCHEDULE INFORMATION: Procedure has been scheduled as follows:  Date: 01/12/2020, Time: 1:15 Location: [facility] with Dr. Abbey Chatters  This Gastroenterology Pre-Precedure Review Form is being routed to the following provider(s): Walden Field, NP

## 2019-12-15 NOTE — Progress Notes (Signed)
Ok to schedule.  ASA I/II 

## 2019-12-18 ENCOUNTER — Other Ambulatory Visit: Payer: Self-pay | Admitting: *Deleted

## 2020-01-10 ENCOUNTER — Other Ambulatory Visit (HOSPITAL_COMMUNITY)
Admission: RE | Admit: 2020-01-10 | Discharge: 2020-01-10 | Disposition: A | Discharge status: Home / Self Care | Payer: 59 | Source: Ambulatory Visit | Attending: Internal Medicine | Admitting: Internal Medicine

## 2020-01-10 ENCOUNTER — Other Ambulatory Visit: Payer: Self-pay

## 2020-01-10 DIAGNOSIS — Z20822 Contact with and (suspected) exposure to covid-19: Secondary | ICD-10-CM | POA: Insufficient documentation

## 2020-01-10 DIAGNOSIS — Z01812 Encounter for preprocedural laboratory examination: Secondary | ICD-10-CM | POA: Insufficient documentation

## 2020-01-10 LAB — SARS CORONAVIRUS 2 (TAT 6-24 HRS): SARS Coronavirus 2: NEGATIVE

## 2020-01-11 ENCOUNTER — Telehealth: Payer: Self-pay

## 2020-01-11 NOTE — Telephone Encounter (Signed)
Tried to call pt to see if she can arrive earlier tomorrow for TCS, LMOVM for return call.

## 2020-01-11 NOTE — Telephone Encounter (Signed)
Spoke to pt, someone from hospital has already moved up TCS time.

## 2020-01-12 ENCOUNTER — Other Ambulatory Visit: Payer: Self-pay

## 2020-01-12 ENCOUNTER — Ambulatory Visit (HOSPITAL_COMMUNITY)
Admission: RE | Admit: 2020-01-12 | Discharge: 2020-01-12 | Disposition: A | Discharge status: Home / Self Care | Payer: 59 | Attending: Internal Medicine | Admitting: Internal Medicine

## 2020-01-12 ENCOUNTER — Encounter (HOSPITAL_COMMUNITY)
Admission: RE | Disposition: A | Discharge status: Home / Self Care | Payer: Self-pay | Source: Home / Self Care | Attending: Internal Medicine

## 2020-01-12 ENCOUNTER — Ambulatory Visit (HOSPITAL_COMMUNITY): Payer: 59 | Admitting: Anesthesiology

## 2020-01-12 ENCOUNTER — Encounter (HOSPITAL_COMMUNITY): Payer: Self-pay

## 2020-01-12 DIAGNOSIS — K5730 Diverticulosis of large intestine without perforation or abscess without bleeding: Secondary | ICD-10-CM | POA: Insufficient documentation

## 2020-01-12 DIAGNOSIS — Z808 Family history of malignant neoplasm of other organs or systems: Secondary | ICD-10-CM | POA: Diagnosis not present

## 2020-01-12 DIAGNOSIS — Z79899 Other long term (current) drug therapy: Secondary | ICD-10-CM | POA: Diagnosis not present

## 2020-01-12 DIAGNOSIS — Z1211 Encounter for screening for malignant neoplasm of colon: Secondary | ICD-10-CM

## 2020-01-12 DIAGNOSIS — Z96641 Presence of right artificial hip joint: Secondary | ICD-10-CM | POA: Insufficient documentation

## 2020-01-12 DIAGNOSIS — K648 Other hemorrhoids: Secondary | ICD-10-CM | POA: Insufficient documentation

## 2020-01-12 HISTORY — PX: COLONOSCOPY WITH PROPOFOL: SHX5780

## 2020-01-12 SURGERY — COLONOSCOPY WITH PROPOFOL
Anesthesia: General

## 2020-01-12 MED ORDER — CHLORHEXIDINE GLUCONATE CLOTH 2 % EX PADS: 6.0000 | MEDICATED_PAD | Freq: Once | CUTANEOUS | Status: DC

## 2020-01-12 MED ORDER — LIDOCAINE HCL (CARDIAC) PF 50 MG/5ML IV SOSY
PREFILLED_SYRINGE | INTRAVENOUS | Status: DC | PRN
  Administered 2020-01-12: 50 mg via INTRAVENOUS

## 2020-01-12 MED ORDER — PROPOFOL 500 MG/50ML IV EMUL
INTRAVENOUS | Status: DC | PRN
  Administered 2020-01-12: 100 ug/kg/min via INTRAVENOUS

## 2020-01-12 MED ORDER — LACTATED RINGERS IV SOLN: INTRAVENOUS | Status: DC

## 2020-01-12 MED ORDER — PROPOFOL 10 MG/ML IV BOLUS
INTRAVENOUS | Status: DC | PRN
  Administered 2020-01-12: 130 mg via INTRAVENOUS
  Administered 2020-01-12: 100 mg via INTRAVENOUS

## 2020-01-12 MED ORDER — STERILE WATER FOR IRRIGATION IR SOLN
Status: DC | PRN
  Administered 2020-01-12: 100 mL

## 2020-01-12 NOTE — Anesthesia Procedure Notes (Signed)
Date/Time: 01/12/2020 10:06 AM Performed by: Vista Deck, CRNA Pre-anesthesia Checklist: Patient identified, Emergency Drugs available, Suction available, Timeout performed and Patient being monitored Patient Re-evaluated:Patient Re-evaluated prior to induction Oxygen Delivery Method: Nasal Cannula

## 2020-01-12 NOTE — Op Note (Signed)
[facility] Patient Name: Tammy Garner Procedure Date: 01/12/2020 10:06 AM MRN: [id number] Date of Birth: December 03, 1965 Attending MD: Elon Alas. Edgar Frisk CSN: [id number] Age: 55 Admit Type: Outpatient Procedure:                Colonoscopy Indications:              Screening for colorectal malignant neoplasm Providers:                Elon Alas. Abbey Chatters, DO, Tammy Vaught, RN, Nelma Rothman,                            Technician Referring MD:              Medicines:                See the Anesthesia note for documentation of the                            administered medications Complications:            No immediate complications. Estimated Blood Loss:     Estimated blood loss: none. Procedure:                Pre-Anesthesia Assessment:                           - The anesthesia plan was to use monitored                            anesthesia care (MAC).                           After obtaining informed consent, the colonoscope                            was passed under direct vision. Throughout the                            procedure, the patient's blood pressure, pulse, and                            oxygen saturations were monitored continuously. The                            [id number]-HQ190L ([id number]) scope was introduced through                            the anus and advanced to the the cecum, identified                            by appendiceal orifice and ileocecal valve. The                            colonoscopy was performed without difficulty. The                            patient tolerated the procedure well. The quality  of the bowel preparation was evaluated using the                            BBPS [facility] Bowel Preparation Scale) with scores                            of: Right Colon = 3, Transverse Colon = 3 and Left                            Colon = 3 (entire mucosa seen well with no residual                            staining, small  fragments of stool or opaque                            liquid). The total BBPS score equals 9. Scope In: 10:15:44 AM Scope Out: 10:24:10 AM Scope Withdrawal Time: 0 hours 6 minutes 30 seconds  Total Procedure Duration: 0 hours 8 minutes 26 seconds  Findings:      The perianal and digital rectal examinations were normal.      Non-bleeding internal hemorrhoids were found during endoscopy.      A few small-mouthed diverticula were found in the sigmoid colon.      The exam was otherwise without abnormality. Impression:               - Non-bleeding internal hemorrhoids.                           - Diverticulosis in the sigmoid colon.                           - The examination was otherwise normal.                           - No specimens collected. Moderate Sedation:      Per Anesthesia Care Recommendation:           - Patient has a contact number available for                            emergencies. The signs and symptoms of potential                            delayed complications were discussed with the                            patient. Return to normal activities tomorrow.                            Written discharge instructions were provided to the                            patient.                           - Resume previous diet.                           -  Continue present medications.                           - Repeat colonoscopy in 10 years for screening                            purposes.                           - Return to GI clinic PRN. Procedure Code(s):        --- Professional ---                           H2257, Colorectal cancer screening; colonoscopy on                            individual not meeting criteria for high risk Diagnosis Code(s):        --- Professional ---                           Z12.11, Encounter for screening for malignant                            neoplasm of colon                           K64.8, Other hemorrhoids                            K57.30, Diverticulosis of large intestine without                            perforation or abscess without bleeding CPT copyright 2019 [employer]. All rights reserved. The codes documented in this report are preliminary and upon coder review may  be revised to meet current compliance requirements. Elon Alas. Abbey Chatters, DO [address] Abbey Chatters, DO 01/12/2020 10:29:20 AM This report has been signed electronically. Number of Addenda: 0

## 2020-01-12 NOTE — Discharge Instructions (Addendum)
Colonoscopy Discharge Instructions  Read the instructions outlined below and refer to this sheet in the next few weeks. These discharge instructions provide you with general information on caring for yourself after you leave the hospital. Your doctor may also give you specific instructions. While your treatment has been planned according to the most current medical practices available, unavoidable complications occasionally occur.   ACTIVITY  You may resume your regular activity, but move at a slower pace for the next 24 hours.   Take frequent rest periods for the next 24 hours.   Walking will help get rid of the air and reduce the bloated feeling in your belly (abdomen).   No driving for 24 hours (because of the medicine (anesthesia) used during the test).    Do not sign any important legal documents or operate any machinery for 24 hours (because of the anesthesia used during the test).  NUTRITION  Drink plenty of fluids.   You may resume your normal diet as instructed by your doctor.   Begin with a light meal and progress to your normal diet. Heavy or fried foods are harder to digest and may make you feel sick to your stomach (nauseated).   Avoid alcoholic beverages for 24 hours or as instructed.  MEDICATIONS  You may resume your normal medications unless your doctor tells you otherwise.  WHAT YOU CAN EXPECT TODAY  Some feelings of bloating in the abdomen.   Passage of more gas than usual.   Spotting of blood in your stool or on the toilet paper.  IF YOU HAD POLYPS REMOVED DURING THE COLONOSCOPY:  No aspirin products for 7 days or as instructed.   No alcohol for 7 days or as instructed.   Eat a soft diet for the next 24 hours.  FINDING OUT THE RESULTS OF YOUR TEST Not all test results are available during your visit. If your test results are not back during the visit, make an appointment with your caregiver to find out the results. Do not assume everything is normal if  you have not heard from your caregiver or the medical facility. It is important for you to follow up on all of your test results.  SEEK IMMEDIATE MEDICAL ATTENTION IF:  You have more than a spotting of blood in your stool.   Your belly is swollen (abdominal distention).   You are nauseated or vomiting.   You have a temperature over 101.   You have abdominal pain or discomfort that is severe or gets worse throughout the day.   Your colonoscopy was relatively unremarkable.  I did not find any evidence of colon polyps or cancer. You do have diverticulosis and internal hemorrhoids. I would recommend increasing fiber in your diet or adding OTC Benefiber/Metamucil. Be sure to drink at least 4 to 6 glasses of water daily. Follow-up with GI as needed.  Repeat colonoscopy in 10 years for screening purposes.   I hope you have a great rest of your week!  Elon Alas. Abbey Chatters, D.O. Gastroenterology and Hepatology [facility]   Diverticulosis  Diverticulosis is a condition that develops when small pouches (diverticula) form in the wall of the large intestine (colon). The colon is where water is absorbed and stool (feces) is formed. The pouches form when the inside layer of the colon pushes through weak spots in the outer layers of the colon. You may have a few pouches or many of them. The pouches usually do not cause problems unless they become inflamed  or infected. When this happens, the condition is called diverticulitis. What are the causes? The cause of this condition is not known. What increases the risk? The following factors may make you more likely to develop this condition:  Being older than age 25. Your risk for this condition increases with age. Diverticulosis is rare among people younger than age 74. By age 31, many people have it.  Eating a low-fiber diet.  Having frequent constipation.  Being overweight.  Not getting enough  exercise.  Smoking.  Taking over-the-counter pain medicines, like aspirin and ibuprofen.  Having a family history of diverticulosis. What are the signs or symptoms? In most people, there are no symptoms of this condition. If you do have symptoms, they may include:  Bloating.  Cramps in the abdomen.  Constipation or diarrhea.  Pain in the lower left side of the abdomen. How is this diagnosed? Because diverticulosis usually has no symptoms, it is most often diagnosed during an exam for other colon problems. The condition may be diagnosed by:  Using a flexible scope to examine the colon (colonoscopy).  Taking an X-ray of the colon after dye has been put into the colon (barium enema).  Having a CT scan. How is this treated? You may not need treatment for this condition. Your health care provider may recommend treatment to prevent problems. You may need treatment if you have symptoms or if you previously had diverticulitis. Treatment may include:  Eating a high-fiber diet.  Taking a fiber supplement.  Taking a live bacteria supplement (probiotic).  Taking medicine to relax your colon. Follow these instructions at home: Medicines  Take over-the-counter and prescription medicines only as told by your health care provider.  If told by your health care provider, take a fiber supplement or probiotic. Constipation prevention Your condition may cause constipation. To prevent or treat constipation, you may need to:  Drink enough fluid to keep your urine pale yellow.  Take over-the-counter or prescription medicines.  Eat foods that are high in fiber, such as beans, whole grains, and fresh fruits and vegetables.  Limit foods that are high in fat and processed sugars, such as fried or sweet foods.  General instructions  Try not to strain when you have a bowel movement.  Keep all follow-up visits as told by your health care provider. This is important. Contact a health care  provider if you:  Have pain in your abdomen.  Have bloating.  Have cramps.  Have not had a bowel movement in 3 days. Get help right away if:  Your pain gets worse.  Your bloating becomes very bad.  You have a fever or chills, and your symptoms suddenly get worse.  You vomit.  You have bowel movements that are bloody or black.  You have bleeding from your rectum. Summary  Diverticulosis is a condition that develops when small pouches (diverticula) form in the wall of the large intestine (colon).  You may have a few pouches or many of them.  This condition is most often diagnosed during an exam for other colon problems.  Treatment may include increasing the fiber in your diet, taking supplements, or taking medicines. This information is not intended to replace advice given to you by your health care provider. Make sure you discuss any questions you have with your health care provider. Document Revised: 07/21/2018 Document Reviewed: 07/21/2018 Elsevier Patient Education  [place].  Hemorrhoids Hemorrhoids are swollen veins that may develop:  In the butt (rectum). These are  called internal hemorrhoids.  Around the opening of the butt (anus). These are called external hemorrhoids. Hemorrhoids can cause pain, itching, or bleeding. Most of the time, they do not cause serious problems. They usually get better with diet changes, lifestyle changes, and other home treatments. What are the causes? This condition may be caused by:  Having trouble pooping (constipation).  Pushing hard (straining) to poop.  Watery poop (diarrhea).  Pregnancy.  Being very overweight (obese).  Sitting for long periods of time.  Heavy lifting or other activity that causes you to strain.  Anal sex.  Riding a bike for a long period of time. What are the signs or symptoms? Symptoms of this condition include:  Pain.  Itching or soreness in the butt.  Bleeding from the  butt.  Leaking poop.  Swelling in the area.  One or more lumps around the opening of your butt. How is this diagnosed? A doctor can often diagnose this condition by looking at the affected area. The doctor may also:  Do an exam that involves feeling the area with a gloved hand (digital rectal exam).  Examine the area inside your butt using a small tube (anoscope).  Order blood tests. This may be done if you have lost a lot of blood.  Have you get a test that involves looking inside the colon using a flexible tube with a camera on the end (sigmoidoscopy or colonoscopy). How is this treated? This condition can usually be treated at home. Your doctor may tell you to change what you eat, make lifestyle changes, or try home treatments. If these do not help, procedures can be done to remove the hemorrhoids or make them smaller. These may involve:  Placing rubber bands at the base of the hemorrhoids to cut off their blood supply.  Injecting medicine into the hemorrhoids to shrink them.  Shining a type of light energy onto the hemorrhoids to cause them to fall off.  Doing surgery to remove the hemorrhoids or cut off their blood supply. Follow these instructions at home: Eating and drinking   Eat foods that have a lot of fiber in them. These include whole grains, beans, nuts, fruits, and vegetables.  Ask your doctor about taking products that have added fiber (fibersupplements).  Reduce the amount of fat in your diet. You can do this by: ? Eating low-fat dairy products. ? Eating less red meat. ? Avoiding processed foods.  Drink enough fluid to keep your pee (urine) pale yellow. Managing pain and swelling   Take a warm-water bath (sitz bath) for 20 minutes to ease pain. Do this 3-4 times a day. You may do this in a bathtub or using a portable sitz bath that fits over the toilet.  If told, put ice on the painful area. It may be helpful to use ice between your warm baths. ? Put ice  in a plastic bag. ? Place a towel between your skin and the bag. ? Leave the ice on for 20 minutes, 2-3 times a day. General instructions  Take over-the-counter and prescription medicines only as told by your doctor. ? Medicated creams and medicines may be used as told.  Exercise often. Ask your doctor how much and what kind of exercise is best for you.  Go to the bathroom when you have the urge to poop. Do not wait.  Avoid pushing too hard when you poop.  Keep your butt dry and clean. Use wet toilet paper or moist towelettes after pooping.  Do not sit on the toilet for a long time.  Keep all follow-up visits as told by your doctor. This is important. Contact a doctor if you:  Have pain and swelling that do not get better with treatment or medicine.  Have trouble pooping.  Cannot poop.  Have pain or swelling outside the area of the hemorrhoids. Get help right away if you have:  Bleeding that will not stop. Summary  Hemorrhoids are swollen veins in the butt or around the opening of the butt.  They can cause pain, itching, or bleeding.  Eat foods that have a lot of fiber in them. These include whole grains, beans, nuts, fruits, and vegetables.  Take a warm-water bath (sitz bath) for 20 minutes to ease pain. Do this 3-4 times a day. This information is not intended to replace advice given to you by your health care provider. Make sure you discuss any questions you have with your health care provider. Document Revised: 12/30/2017 Document Reviewed: 05/13/2017 Elsevier Patient Education  [place].  High-Fiber Diet Fiber, also called dietary fiber, is a type of carbohydrate that is found in fruits, vegetables, whole grains, and beans. A high-fiber diet can have many health benefits. Your health care provider may recommend a high-fiber diet to help:  Prevent constipation. Fiber can make your bowel movements more regular.  Lower your cholesterol.  Relieve the  following conditions: ? Swelling of veins in the anus (hemorrhoids). ? Swelling and irritation (inflammation) of specific areas of the digestive tract (uncomplicated diverticulosis). ? A problem of the large intestine (colon) that sometimes causes pain and diarrhea (irritable bowel syndrome, IBS).  Prevent overeating as part of a weight-loss plan.  Prevent heart disease, type 2 diabetes, and certain cancers. What is my plan? The recommended daily fiber intake in grams (g) includes:  38 g for men age 52 or younger.  30 g for men over age 52.  67 g for women age 93 or younger.  21 g for women over age 60. You can get the recommended daily intake of dietary fiber by:  Eating a variety of fruits, vegetables, grains, and beans.  Taking a fiber supplement, if it is not possible to get enough fiber through your diet. What do I need to know about a high-fiber diet?  It is better to get fiber through food sources rather than from fiber supplements. There is not a lot of research about how effective supplements are.  Always check the fiber content on the nutrition facts label of any prepackaged food. Look for foods that contain 5 g of fiber or more per serving.  Talk with a diet and nutrition specialist (dietitian) if you have questions about specific foods that are recommended or not recommended for your medical condition, especially if those foods are not listed below.  Gradually increase how much fiber you consume. If you increase your intake of dietary fiber too quickly, you may have bloating, cramping, or gas.  Drink plenty of water. Water helps you to digest fiber. What are tips for following this plan?  Eat a wide variety of high-fiber foods.  Make sure that half of the grains that you eat each day are whole grains.  Eat breads and cereals that are made with whole-grain flour instead of refined flour or white flour.  Eat brown rice, bulgur wheat, or millet instead of white  rice.  Start the day with a breakfast that is high in fiber, such as a cereal that contains  5 g of fiber or more per serving.  Use beans in place of meat in soups, salads, and pasta dishes.  Eat high-fiber snacks, such as berries, raw vegetables, nuts, and popcorn.  Choose whole fruits and vegetables instead of processed forms like juice or sauce. What foods can I eat?  Fruits Berries. Pears. Apples. Oranges. Avocado. Prunes and raisins. Dried figs. Vegetables Sweet potatoes. Spinach. Kale. Artichokes. Cabbage. Broccoli. Cauliflower. Green peas. Carrots. Squash. Grains Whole-grain breads. Multigrain cereal. Oats and oatmeal. Brown rice. Barley. Bulgur wheat. [place]. Quinoa. Bran muffins. Popcorn. Rye wafer crackers. Meats and other proteins Navy, kidney, and pinto beans. Soybeans. Split peas. Lentils. Nuts and seeds. Dairy Fiber-fortified yogurt. Beverages Fiber-fortified soy milk. Fiber-fortified orange juice. Other foods Fiber bars. The items listed above may not be a complete list of recommended foods and beverages. Contact a dietitian for more options. What foods are not recommended? Fruits Fruit juice. Cooked, strained fruit. Vegetables Fried potatoes. Canned vegetables. Well-cooked vegetables. Grains White bread. Pasta made with refined flour. White rice. Meats and other proteins Fatty cuts of meat. Fried chicken or fried fish. Dairy Milk. Yogurt. Cream cheese. Sour cream. Fats and oils Butters. Beverages Soft drinks. Other foods Cakes and pastries. The items listed above may not be a complete list of foods and beverages to avoid. Contact a dietitian for more information. Summary  Fiber is a type of carbohydrate. It is found in fruits, vegetables, whole grains, and beans.  There are many health benefits of eating a high-fiber diet, such as preventing constipation, lowering blood cholesterol, helping with weight loss, and reducing your risk of heart disease,  diabetes, and certain cancers.  Gradually increase your intake of fiber. Increasing too fast can result in cramping, bloating, and gas. Drink plenty of water while you increase your fiber.  The best sources of fiber include whole fruits and vegetables, whole grains, nuts, seeds, and beans. This information is not intended to replace advice given to you by your health care provider. Make sure you discuss any questions you have with your health care provider. Document Revised: 10/26/2016 Document Reviewed: 10/26/2016 Elsevier Patient Education  2020 [employer].

## 2020-01-12 NOTE — H&P (Signed)
Primary Care Physician:  Erven Colla, DO Primary Gastroenterologist:  Dr. Abbey Chatters  Pre-Procedure History & Physical: HPI:  Tammy Garner is a 54 y.o. female is here for a colonoscopy for colon cancer screening purposes.  Patient denies any family history of colorectal cancer.  No melena or hematochezia.  No abdominal pain or unintentional weight loss.  No change in bowel habits.  Overall feels well from a GI standpoint.  Past Medical History:  Diagnosis Date  . Allergy   . Asthma   . Dysmenorrhea 10/04/2013  . Environmental allergies   . Family history of adverse reaction to anesthesia    mom had facial and neck swelling with TAH 35 yrs ago; pt has had no prob  . Fibroid 10/16/2013  . Ovarian cyst 10/16/2013    Past Surgical History:  Procedure Laterality Date  . CESAREAN SECTION    . DILATION AND CURETTAGE OF UTERUS    . ENDOMETRIAL ABLATION    . LAPAROSCOPIC BILATERAL SALPINGECTOMY Bilateral 03/05/2015   Procedure: LAPAROSCOPIC BILATERAL SALPINGECTOMY;  Surgeon: Jonnie Kind, MD;  Location: [facility];  Service: Gynecology;  Laterality: Bilateral;  . LAPAROSCOPIC SUPRACERVICAL HYSTERECTOMY N/A 03/05/2015   Procedure: LAPAROSCOPIC SUPRACERVICAL HYSTERECTOMY;  Surgeon: Jonnie Kind, MD;  Location: [facility];  Service: Gynecology;  Laterality: N/A;  . NEVUS EXCISION Left 03/05/2015   Procedure: NEVUS EXCISION FROM LEFT ABDOMINAL WALL;  Surgeon: Jonnie Kind, MD;  Location: [facility];  Service: Gynecology;  Laterality: Left;  . TOTAL HIP ARTHROPLASTY Right 06/02/2017   Procedure: RIGHT TOTAL HIP ARTHROPLASTY ANTERIOR APPROACH;  Surgeon: Gaynelle Arabian, MD;  Location: [facility];  Service: Orthopedics;  Laterality: Right;  . TUBAL LIGATION      Prior to Admission medications   Medication Sig Start Date End Date Taking? Authorizing Provider  acetaminophen (TYLENOL) 500 MG tablet Take 500 mg by mouth every 6 (six) hours as needed for headache.   Yes [provider]   cetirizine (ZYRTEC) 10 MG tablet Take 10 mg by mouth every evening.   Yes [provider]  ibuprofen (ADVIL) 200 MG tablet Take 400 mg by mouth every 6 (six) hours as needed (Arthritis).   Yes [provider]  Multiple Vitamins-Minerals (MULTIVITAMIN ADULTS) TABS Take 1 tablet by mouth daily.   Yes [provider]  Vitamin D, Ergocalciferol, (DRISDOL) 1.25 MG (50000 UNIT) CAPS capsule Take 1 capsule (50,000 Units total) by mouth every 7 (seven) days. 10/06/19  Yes Nilda Simmer, NP  albuterol (VENTOLIN HFA) 108 (90 Base) MCG/ACT inhaler 2 sprays every 4 -6 hours prn wheezing Patient taking differently: Inhale 2 puffs into the lungs every 4 (four) hours as needed for wheezing or shortness of breath. 03/13/19   Mikey Kirschner, MD  fluticasone (FLONASE) 50 MCG/ACT nasal spray Place 1 spray into both nostrils daily as needed for allergies.    [provider]    Allergies as of 12/18/2019 - Review Complete 12/13/2019  Allergen Reaction Noted  . Augmentin [amoxicillin-pot clavulanate]  03/27/2014    Family History  Problem Relation Age of Onset  . Hypertension Mother   . Heart disease Mother   . Cancer Mother        cervical  . Hypertension Father   . Asthma Sister   . Emphysema Maternal Grandfather   . Cancer Paternal Grandmother        stomach  . Heart attack Paternal Grandfather     Social History   Socioeconomic History  . Marital  status: Married    Spouse name: Not on file  . Number of children: Not on file  . Years of education: Not on file  . Highest education level: Not on file  Occupational History  . Not on file  Tobacco Use  . Smoking status: Former Smoker    Packs/day: 0.25    Years: 30.00    Pack years: 7.50    Types: Cigarettes    Quit date: 06/02/2017    Years since quitting: 2.6  . Smokeless tobacco: Never Used  Vaping Use  . Vaping Use: Former  Substance and Sexual Activity  . Alcohol use: Yes    Alcohol/week: 9.0  standard drinks    Types: 9 Cans of beer per week  . Drug use: No  . Sexual activity: Yes    Birth control/protection: Surgical    Comment: hyst  Other Topics Concern  . Not on file  Social History Narrative  . Not on file   Social Determinants of Health   Financial Resource Strain: Not on file  Food Insecurity: Not on file  Transportation Needs: Not on file  Physical Activity: Not on file  Stress: Not on file  Social Connections: Not on file  Intimate Partner Violence: Not on file    Review of Systems: See HPI, otherwise negative ROS  Impression/Plan: Tammy Garner is here for a colonoscopy to be performed for colon cancer screening purposes.  The risks of the procedure including infection, bleed, or perforation as well as benefits, limitations, alternatives and imponderables have been reviewed with the patient. Questions have been answered. All parties agreeable.

## 2020-01-12 NOTE — Anesthesia Preprocedure Evaluation (Signed)
Anesthesia Evaluation  Patient identified by MRN, date of birth, ID band Patient awake    Reviewed: Allergy & Precautions, H&P , NPO status , Patient's Chart, lab work & pertinent test results, reviewed documented beta blocker date and time   Airway Mallampati: II  TM Distance: >3 FB Neck ROM: full    Dental no notable dental hx.    Pulmonary asthma , former smoker,    Pulmonary exam normal breath sounds clear to auscultation       Cardiovascular Exercise Tolerance: Good negative cardio ROS   Rhythm:regular Rate:Normal     Neuro/Psych  Neuromuscular disease negative psych ROS   GI/Hepatic negative GI ROS, Neg liver ROS,   Endo/Other  negative endocrine ROS  Renal/GU negative Renal ROS  negative genitourinary   Musculoskeletal   Abdominal   Peds  Hematology negative hematology ROS (+)   Anesthesia Other Findings   Reproductive/Obstetrics negative OB ROS                             Anesthesia Physical Anesthesia Plan  ASA: II  Anesthesia Plan: General   Post-op Pain Management:    Induction:   PONV Risk Score and Plan: Propofol infusion  Airway Management Planned:   Additional Equipment:   Intra-op Plan:   Post-operative Plan:   Informed Consent: I have reviewed the patients History and Physical, chart, labs and discussed the procedure including the risks, benefits and alternatives for the proposed anesthesia with the patient or authorized representative who has indicated his/her understanding and acceptance.     Dental Advisory Given  Plan Discussed with: CRNA  Anesthesia Plan Comments:         Anesthesia Quick Evaluation

## 2020-01-12 NOTE — Anesthesia Postprocedure Evaluation (Signed)
Anesthesia Post Note  Patient: Tammy Garner  Procedure(s) Performed: COLONOSCOPY WITH PROPOFOL (N/A )  Patient location during evaluation: Endoscopy Anesthesia Type: General Level of consciousness: awake and alert and patient cooperative Pain management: satisfactory to patient Vital Signs Assessment: post-procedure vital signs reviewed and stable Respiratory status: spontaneous breathing Cardiovascular status: stable Anesthetic complications: no   No complications documented.   Last Vitals:  Vitals:   01/12/20 0842 01/12/20 1028  BP: (!) 158/88 129/62  Pulse:  76  Resp: 17 20  Temp: 37.1 C 36.8 C  SpO2: 99% 98%    Last Pain:  Vitals:   01/12/20 1028  TempSrc: Oral  PainSc: 0-No pain                 Dehlia Kilner

## 2020-01-12 NOTE — Transfer of Care (Signed)
Immediate Anesthesia Transfer of Care Note  Patient: Tammy Garner  Procedure(s) Performed: COLONOSCOPY WITH PROPOFOL (N/A )  Patient Location: Endoscopy Unit  Anesthesia Type:General  Level of Consciousness: awake, alert  and patient cooperative  Airway & Oxygen Therapy: Patient Spontanous Breathing  Post-op Assessment: Report given to RN and Post -op Vital signs reviewed and stable  Post vital signs: Reviewed and stable  Last Vitals:  Vitals Value Taken Time  BP    Temp    Pulse    Resp    SpO2    SEE VITAL SIGN FLOW SHEET  Last Pain:  Vitals:   01/12/20 1009  TempSrc:   PainSc: 0-No pain      Patients Stated Pain Goal: 8 (78/93/81 0175)  Complications: No complications documented.

## 2020-01-17 ENCOUNTER — Encounter (HOSPITAL_COMMUNITY): Payer: Self-pay | Admitting: Internal Medicine

## 2020-11-19 ENCOUNTER — Ambulatory Visit: Payer: 59 | Admitting: Family Medicine

## 2020-11-25 ENCOUNTER — Ambulatory Visit (INDEPENDENT_AMBULATORY_CARE_PROVIDER_SITE_OTHER): Payer: 59 | Admitting: Family Medicine

## 2020-11-25 ENCOUNTER — Other Ambulatory Visit: Payer: Self-pay

## 2020-11-25 VITALS — BP 132/82 | HR 84 | Ht 68.0 in | Wt 199.8 lb

## 2020-11-25 DIAGNOSIS — Z01818 Encounter for other preprocedural examination: Secondary | ICD-10-CM

## 2020-11-25 DIAGNOSIS — M1612 Unilateral primary osteoarthritis, left hip: Secondary | ICD-10-CM

## 2020-11-25 DIAGNOSIS — E559 Vitamin D deficiency, unspecified: Secondary | ICD-10-CM

## 2020-11-25 LAB — POCT URINALYSIS DIPSTICK
Spec Grav, UA: 1.015 (ref 1.010–1.025)
pH, UA: 6 (ref 5.0–8.0)

## 2020-11-25 MED ORDER — ALBUTEROL SULFATE HFA 108 (90 BASE) MCG/ACT IN AERS: INHALATION_SPRAY | RESPIRATORY_TRACT | 0 refills | Status: DC

## 2020-11-25 MED ORDER — VITAMIN D (ERGOCALCIFEROL) 1.25 MG (50000 UNIT) PO CAPS: 50000.0000 [IU] | ORAL_CAPSULE | ORAL | 2 refills | Status: DC

## 2020-11-25 NOTE — Progress Notes (Signed)
   Subjective:    Patient ID: Tammy Garner, female    DOB: Mar 04, 1965, 55 y.o.   MRN: [id number]  HPI Patient  arrives for surgical clearance for left hip replacement. Patient states she had right hip replaced a few years ago and did very well. Patient  states she has preop scheduled tomorrow. This patient had hip replacement surgery She did very well with that She has a new left hip replacement coming up later in December She denies any setbacks recently She is generally active person does her own housework also does some outside activity is able to walk moderate distances without shortness of breath able to carry laundry and groceries without chest pain or shortness of breath  Review of Systems     Objective:   Physical Exam  General-in no acute distress Eyes-no discharge Lungs-respiratory rate normal, CTA CV-no murmurs,RRR Extremities skin warm dry no edema Neuro grossly normal Behavior normal, alert       Assessment & Plan:   1. Primary osteoarthritis of left hip Hip surgery coming up Low risk patient Labs were ordered Will send documentation to her orthopedist I have encouraged her to talk with orthopedics regarding best approach is to minimize complications of surgery including infections and blood clots From a cardiovascular standpoint she is a low risk candidate and it would be fine for her to proceed with surgery  2. Preop examination See above - CBC with Differential/Platelet - Basic metabolic panel - Hemoglobin A1c - Protime-INR - Albumin - POCT urinalysis dipstick  3. Vitamin D deficiency Check labs has history of vitamin D deficiency - VITAMIN D 25 Hydroxy (Vit-D Deficiency, Fractures)

## 2020-11-26 LAB — HEMOGLOBIN A1C
Est. average glucose Bld gHb Est-mCnc: 103 mg/dL
Hgb A1c MFr Bld: 5.2 % (ref 4.8–5.6)

## 2020-11-26 LAB — CBC WITH DIFFERENTIAL/PLATELET
Basophils Absolute: 0.1 10*3/uL (ref 0.0–0.2)
Basos: 1 %
EOS (ABSOLUTE): 0.1 10*3/uL (ref 0.0–0.4)
Eos: 1 %
Hematocrit: 46.8 % — ABNORMAL HIGH (ref 34.0–46.6)
Hemoglobin: 16 g/dL — ABNORMAL HIGH (ref 11.1–15.9)
Immature Grans (Abs): 0 10*3/uL (ref 0.0–0.1)
Immature Granulocytes: 0 %
Lymphocytes Absolute: 2.2 10*3/uL (ref 0.7–3.1)
Lymphs: 21 %
MCH: 31.4 pg (ref 26.6–33.0)
MCHC: 34.2 g/dL (ref 31.5–35.7)
MCV: 92 fL (ref 79–97)
Monocytes Absolute: 0.5 10*3/uL (ref 0.1–0.9)
Monocytes: 5 %
Neutrophils Absolute: 7.8 10*3/uL — ABNORMAL HIGH (ref 1.4–7.0)
Neutrophils: 72 %
Platelets: 295 10*3/uL (ref 150–450)
RBC: 5.09 x10E6/uL (ref 3.77–5.28)
RDW: 12.5 % (ref 11.7–15.4)
WBC: 10.7 10*3/uL (ref 3.4–10.8)

## 2020-11-26 LAB — BASIC METABOLIC PANEL
BUN/Creatinine Ratio: 9 (ref 9–23)
BUN: 6 mg/dL (ref 6–24)
CO2: 25 mmol/L (ref 20–29)
Calcium: 9.9 mg/dL (ref 8.7–10.2)
Chloride: 99 mmol/L (ref 96–106)
Creatinine, Ser: 0.66 mg/dL (ref 0.57–1.00)
Glucose: 83 mg/dL (ref 70–99)
Potassium: 4.5 mmol/L (ref 3.5–5.2)
Sodium: 139 mmol/L (ref 134–144)
eGFR: 105 mL/min/{1.73_m2} (ref >59)

## 2020-11-26 LAB — PROTIME-INR
INR: 1 (ref 0.9–1.2)
Prothrombin Time: 10.7 s (ref 9.1–12.0)

## 2020-11-26 LAB — ALBUMIN: Albumin: 4.8 g/dL (ref 3.8–4.9)

## 2020-11-26 LAB — VITAMIN D 25 HYDROXY (VIT D DEFICIENCY, FRACTURES): Vit D, 25-Hydroxy: 24.6 ng/mL — ABNORMAL LOW (ref 30.0–100.0)

## 2021-02-11 ENCOUNTER — Encounter: Payer: Self-pay | Admitting: Family Medicine

## 2021-02-12 ENCOUNTER — Ambulatory Visit (INDEPENDENT_AMBULATORY_CARE_PROVIDER_SITE_OTHER): Payer: 59 | Admitting: Nurse Practitioner

## 2021-02-12 ENCOUNTER — Other Ambulatory Visit: Payer: Self-pay

## 2021-02-12 ENCOUNTER — Encounter: Payer: Self-pay | Admitting: Nurse Practitioner

## 2021-02-12 VITALS — BP 140/66 | HR 72 | Temp 97.8°F | Wt 208.2 lb

## 2021-02-12 DIAGNOSIS — J310 Chronic rhinitis: Secondary | ICD-10-CM | POA: Diagnosis not present

## 2021-02-12 DIAGNOSIS — J329 Chronic sinusitis, unspecified: Secondary | ICD-10-CM

## 2021-02-12 NOTE — Progress Notes (Signed)
Subjective:    Patient ID: Tammy Garner, female    DOB: January 30, 1965, 56 y.o.   MRN: [id number]  HPI Patient here for runny nose, headache, sinus pressure, upper right jaw pain x4 days. Went to dentist yesterday for xray to rule out dental problem; xray negative.  Patient denies shortness of breath, cough, wheezing, sore throat.  Review of Systems  HENT:  Positive for congestion, postnasal drip, sinus pressure and sinus pain.        Jaw pain      Objective:   Physical Exam Constitutional:      Appearance: Normal appearance. She is normal weight.  HENT:     Right Ear: Ear canal and external ear normal. No decreased hearing noted. No laceration, drainage, swelling or tenderness. A middle ear effusion is present. There is no impacted cerumen. No foreign body. No mastoid tenderness. No PE tube. No hemotympanum. Tympanic membrane is injected. Tympanic membrane is not scarred, perforated, erythematous, retracted or bulging.     Left Ear: Ear canal and external ear normal. No decreased hearing noted. No laceration, drainage, swelling or tenderness. A middle ear effusion is present. There is no impacted cerumen. No foreign body. No mastoid tenderness. No PE tube. No hemotympanum. Tympanic membrane is injected. Tympanic membrane is not scarred, perforated, erythematous, retracted or bulging.     Nose:     Right Turbinates: Not enlarged, swollen or pale.     Left Turbinates: Not enlarged, swollen or pale.     Right Sinus: Maxillary sinus tenderness and frontal sinus tenderness present.     Left Sinus: Maxillary sinus tenderness and frontal sinus tenderness present.     Mouth/Throat:     Mouth: Mucous membranes are moist.     Pharynx: Oropharynx is clear. Uvula midline. No pharyngeal swelling, oropharyngeal exudate, posterior oropharyngeal erythema or uvula swelling.     Tonsils: No tonsillar exudate or tonsillar abscesses. 0 on the right. 0 on the left.  Eyes:     Extraocular Movements:  Extraocular movements intact.     Pupils: Pupils are equal, round, and reactive to light.  Cardiovascular:     Rate and Rhythm: Normal rate and regular rhythm.     Pulses: Normal pulses.     Heart sounds: Normal heart sounds. No murmur heard. Pulmonary:     Effort: Pulmonary effort is normal. No respiratory distress.     Breath sounds: Normal breath sounds. No wheezing.  Musculoskeletal:        General: Normal range of motion.     Cervical back: Normal range of motion and neck supple. No rigidity or tenderness.  Lymphadenopathy:     Cervical: No cervical adenopathy.  Skin:    General: Skin is warm.     Capillary Refill: Capillary refill takes less than 2 seconds.  Neurological:     General: No focal deficit present.     Mental Status: She is alert and oriented to person, place, and time.  Psychiatric:        Mood and Affect: Mood normal.        Behavior: Behavior normal.          Assessment & Plan:  1. Rhinosinusitis -Likely allergy related rhinosinusitis -Use nasal saline spray for symptom relief -May use humidifier for symptom relief -Use allergy medication such as over-the-counter Flonase and Zyrtec for symptom relief -Notify clinic if symptoms not better in approximately 6 days.  Antibiotics may be warranted at that time. -Return to clinic if symptoms  worsen, get better then worsen, or change in severity. -COVID testing discussed with patient.  Patient believes that she has not been exposed to [place] because she has been home for the past 2 months with very little outings due to recent hip surgery.  Testing not done at this time.

## 2021-12-16 ENCOUNTER — Telehealth: Payer: 59 | Admitting: Nurse Practitioner

## 2021-12-16 DIAGNOSIS — R051 Acute cough: Secondary | ICD-10-CM | POA: Diagnosis not present

## 2021-12-16 DIAGNOSIS — J0110 Acute frontal sinusitis, unspecified: Secondary | ICD-10-CM

## 2021-12-16 MED ORDER — FLUTICASONE PROPIONATE 50 MCG/ACT NA SUSP: 2.0000 | Freq: Every day | NASAL | 6 refills | Status: DC

## 2021-12-16 MED ORDER — AZITHROMYCIN 250 MG PO TABS: ORAL_TABLET | ORAL | 0 refills | Status: AC

## 2021-12-16 MED ORDER — BENZONATATE 100 MG PO CAPS: 100.0000 mg | ORAL_CAPSULE | Freq: Three times a day (TID) | ORAL | 0 refills | Status: DC | PRN

## 2021-12-16 MED ORDER — AZITHROMYCIN 250 MG PO TABS: ORAL_TABLET | ORAL | 0 refills | Status: DC

## 2021-12-16 NOTE — Progress Notes (Signed)
We are sorry that you are not feeling well.  Here is how we plan to help!  Based on your presentation I believe you most likely have A cough due to bacteria.  When patients have a fever and a productive cough with a change in color or increased sputum production, we are concerned about bacterial bronchitis.  If left untreated it can progress to pneumonia.  If your symptoms do not improve with your treatment plan it is important that you contact your provider.   I have prescribed Azithromyin 250 mg: two tablets now and then one tablet daily for 4 additonal days    In addition you may use A prescription cough medication called Tessalon Perles '100mg'$ . You may take 1-2 capsules every 8 hours as needed for your cough.  We would also recommend a nasal spray, Flonase that we will also prescribe to help with your sinus congestion.   Meds ordered this encounter  Medications   benzonatate (TESSALON) 100 MG capsule    Sig: Take 1 capsule (100 mg total) by mouth 3 (three) times daily as needed.    Dispense:  30 capsule    Refill:  0   azithromycin (ZITHROMAX) 250 MG tablet    Sig: Take 2 tablets on day 1, then 1 tablet daily on days 2 through 5    Dispense:  6 tablet    Refill:  0   fluticasone (FLONASE) 50 MCG/ACT nasal spray    Sig: Place 2 sprays into both nostrils daily.    Dispense:  16 g    Refill:  6    From your responses in the eVisit questionnaire you describe inflammation in the upper respiratory tract which is causing a significant cough.  This is commonly called Bronchitis and has four common causes:   Allergies Viral Infections Acid Reflux Bacterial Infection Allergies, viruses and acid reflux are treated by controlling symptoms or eliminating the cause. An example might be a cough caused by taking certain blood pressure medications. You stop the cough by changing the medication. Another example might be a cough caused by acid reflux. Controlling the reflux helps control the  cough.  USE OF BRONCHODILATOR ("RESCUE") INHALERS: There is a risk from using your bronchodilator too frequently.  The risk is that over-reliance on a medication which only relaxes the muscles surrounding the breathing tubes can reduce the effectiveness of medications prescribed to reduce swelling and congestion of the tubes themselves.  Although you feel brief relief from the bronchodilator inhaler, your asthma may actually be worsening with the tubes becoming more swollen and filled with mucus.  This can delay other crucial treatments, such as oral steroid medications. If you need to use a bronchodilator inhaler daily, several times per day, you should discuss this with your provider.  There are probably better treatments that could be used to keep your asthma under control.     HOME CARE Only take medications as instructed by your medical team. Complete the entire course of an antibiotic. Drink plenty of fluids and get plenty of rest. Avoid close contacts especially the very young and the elderly Cover your mouth if you cough or cough into your sleeve. Always remember to wash your hands A steam or ultrasonic humidifier can help congestion.   GET HELP RIGHT AWAY IF: You develop worsening fever. You become short of breath You cough up blood. Your symptoms persist after you have completed your treatment plan MAKE SURE YOU  Understand these instructions. Will watch your condition.  Will get help right away if you are not doing well or get worse.    Thank you for choosing an e-visit.  Your e-visit answers were reviewed by a board certified advanced clinical practitioner to complete your personal care plan. Depending upon the condition, your plan could have included both over the counter or prescription medications.  Please review your pharmacy choice. Make sure the pharmacy is open so you can pick up prescription now. If there is a problem, you may contact your provider through [employer] and have the prescription routed to another pharmacy.  Your safety is important to [place]. If you have drug allergies check your prescription carefully.   For the next 24 hours you can use MyChart to ask questions about today's visit, request a non-urgent call back, or ask for a work or school excuse. You will get an email in the next two days asking about your experience. I hope that your e-visit has been valuable and will speed your recovery.  I spent approximately 5 minutes reviewing the patient's history, current symptoms and coordinating their care today.

## 2021-12-16 NOTE — Addendum Note (Signed)
Addended by: Brunetta Jeans on: 12/16/2021 04:44 PM   Modules accepted: Orders

## 2022-03-06 ENCOUNTER — Other Ambulatory Visit: Payer: Self-pay | Admitting: Family Medicine

## 2022-03-06 DIAGNOSIS — Z1231 Encounter for screening mammogram for malignant neoplasm of breast: Secondary | ICD-10-CM

## 2022-05-26 IMAGING — MG DIGITAL SCREENING BILAT W/ TOMO W/ CAD
8 series · 8 of 24 positions shown · non-contrast
Comparison: Previous exam(s).

CLINICAL DATA: Screening.

EXAM:
DIGITAL SCREENING BILATERAL MAMMOGRAM WITH TOMO AND CAD

[R CC synth-2D]
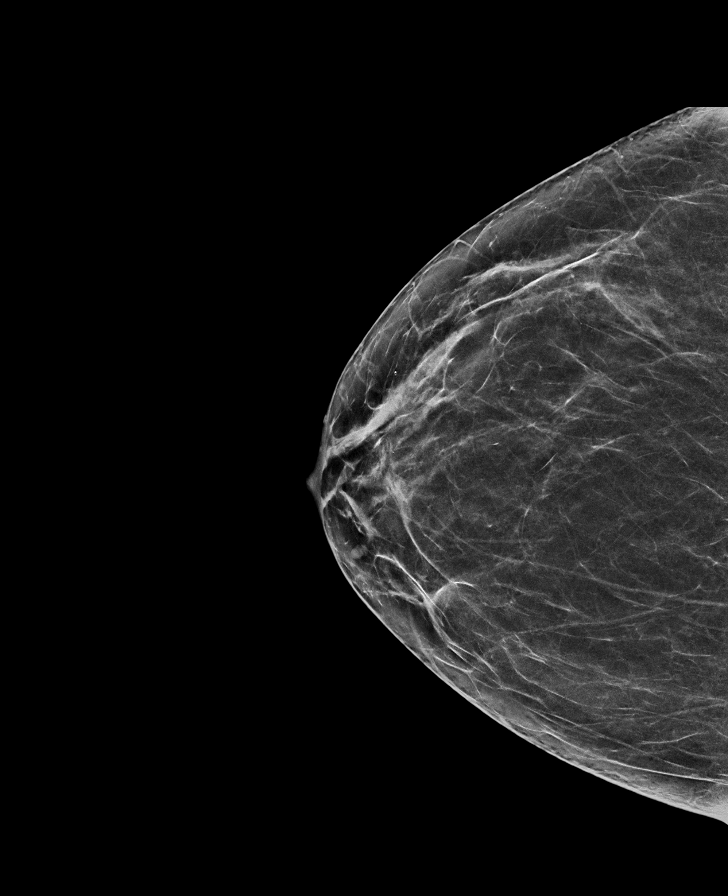

[L CC synth-2D]
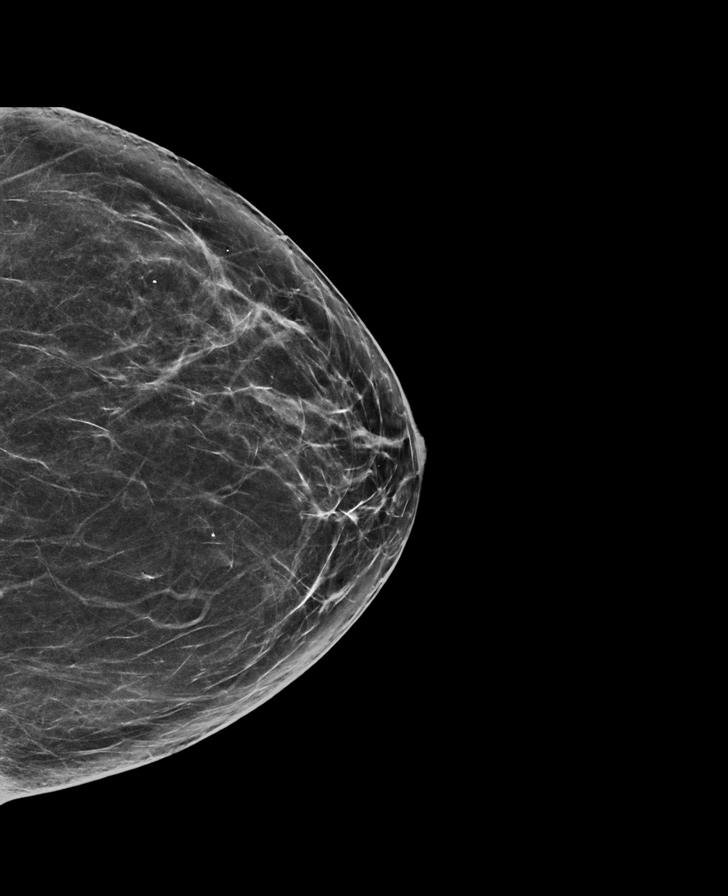

[R MLO synth-2D]
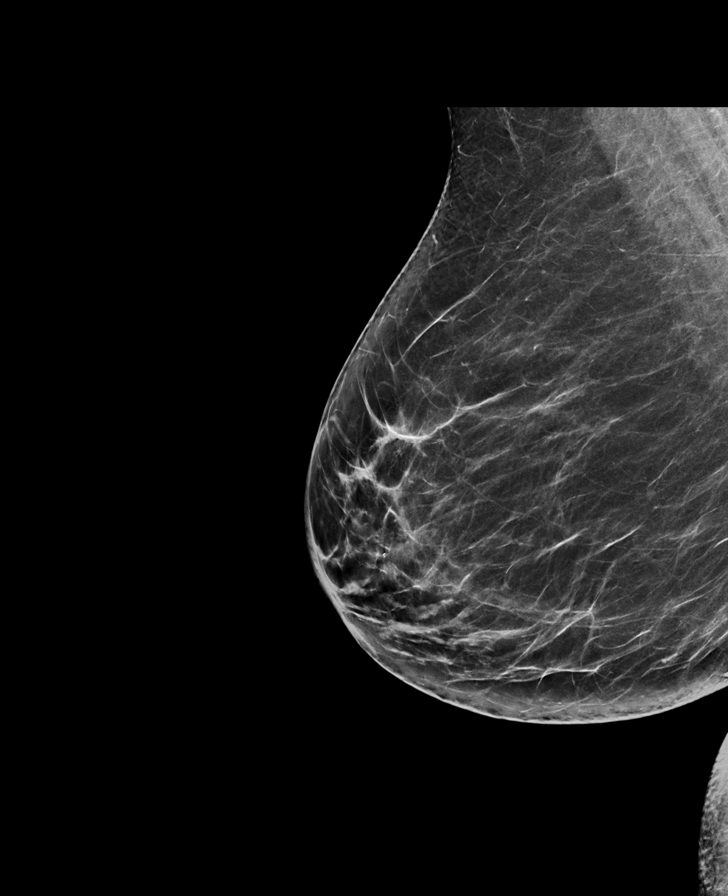

[L MLO synth-2D]
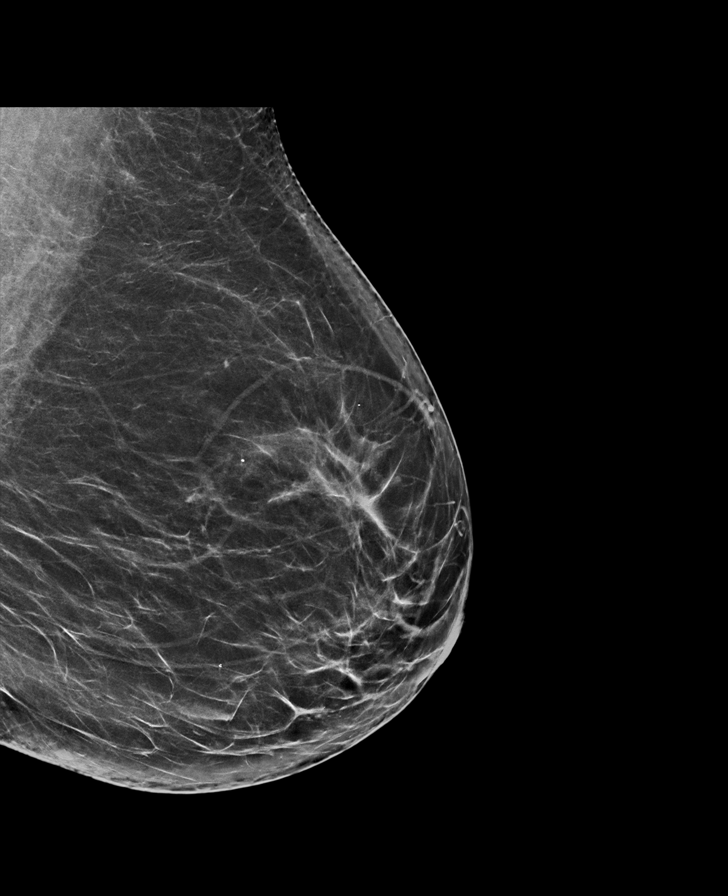

[R CC tomo · tomo slice 33/64.0]
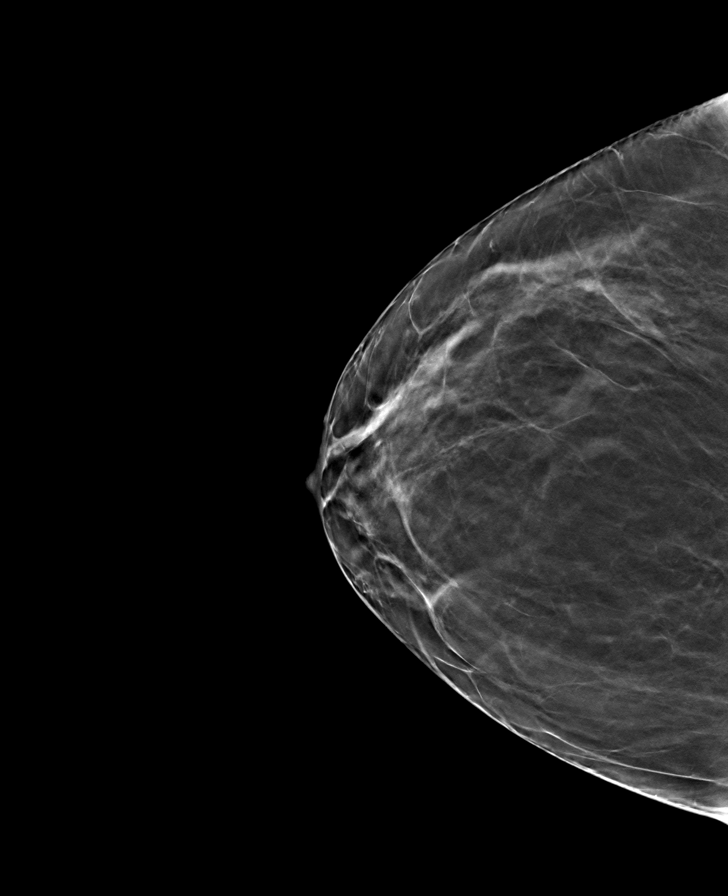

[R MLO tomo · tomo slice 38/75.0]
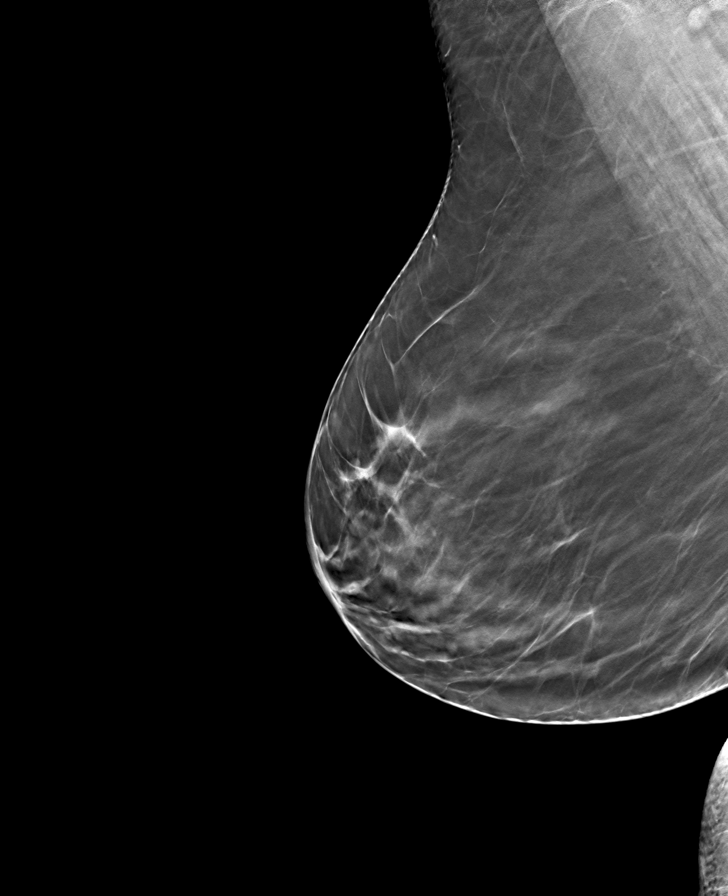

[L MLO tomo · tomo slice 38/75.0]
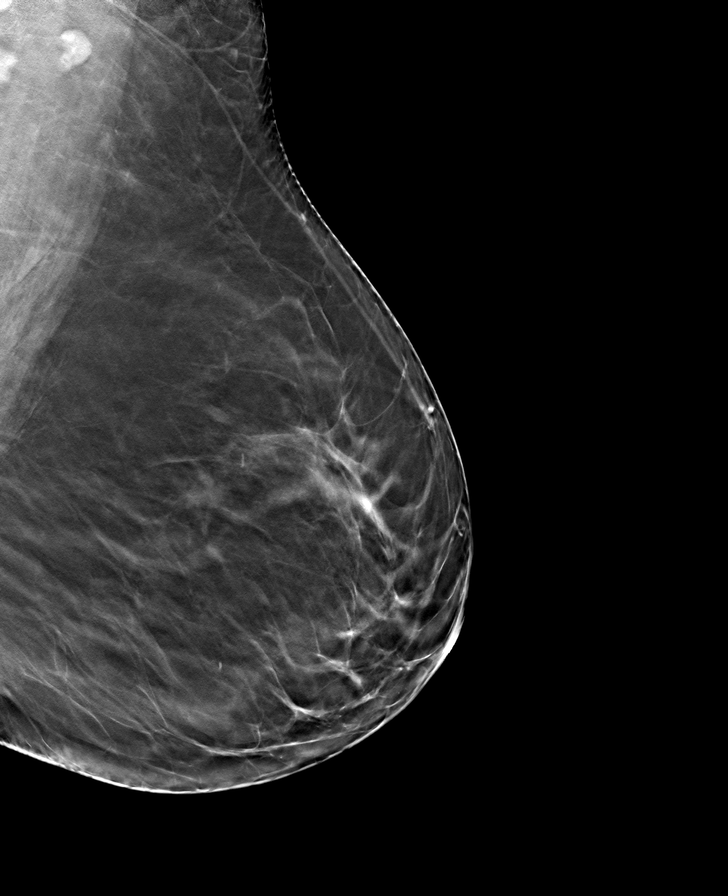

[L CC tomo · tomo slice 35/69.0]
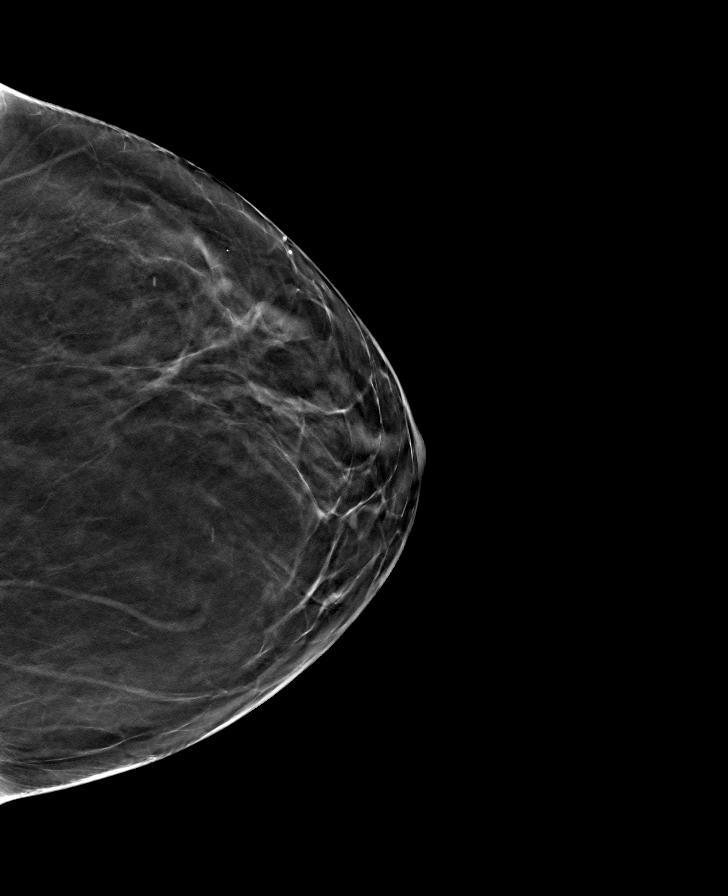

[8 of 24 positions shown; findings below may reference images not displayed]

ACR Breast Density Category b: There are scattered areas of
fibroglandular density.
FINDINGS: There are no findings suspicious for malignancy. Images were
processed with CAD.
IMPRESSION: No mammographic evidence of malignancy. A result letter of this
screening mammogram will be mailed directly to the patient.

RECOMMENDATION:
Screening mammogram in one year. (Code:[id number])

BI-RADS CATEGORY  1: Negative.

## 2022-11-16 ENCOUNTER — Other Ambulatory Visit: Payer: Self-pay | Admitting: Family Medicine

## 2023-03-16 ENCOUNTER — Other Ambulatory Visit: Payer: Self-pay | Admitting: Family Medicine

## 2023-03-16 MED ORDER — ALBUTEROL SULFATE HFA 108 (90 BASE) MCG/ACT IN AERS: INHALATION_SPRAY | RESPIRATORY_TRACT | 3 refills | Status: DC

## 2023-03-16 NOTE — Telephone Encounter (Signed)
 Copied from CRM [id number]. Topic: Clinical - Medication Refill >> Mar 16, 2023 10:24 AM Gery Pray wrote: Most Recent Primary Care Visit:   Medication: albuterol (VENTOLIN HFA) 108 (90 Base) MCG/ACT inhaler  Has the patient contacted their pharmacy? Yes (Agent: If no, request that the patient contact the pharmacy for the refill. If patient does not wish to contact the pharmacy document the reason why and proceed with request.) (Agent: If yes, when and what did the pharmacy advise?) Pharmacy stated patient needed an appt with PCP  Is this the correct pharmacy for this prescription? Yes If no, delete pharmacy and type the correct one.  This is the patient's preferred pharmacy:  [facility] [place], [place] - [id number] [place] [address] Phone: [phone] Fax: [phone]   Has the prescription been filled recently? No  Is the patient out of the medication? Yes  Has the patient been seen for an appointment in the last year OR does the patient have an upcoming appointment? Yes  Can we respond through MyChart? Yes  Agent: Please be advised that Rx refills may take up to 3 business days. We ask that you follow-up with your pharmacy.

## 2023-04-23 ENCOUNTER — Ambulatory Visit (INDEPENDENT_AMBULATORY_CARE_PROVIDER_SITE_OTHER): Payer: Self-pay | Admitting: Physician Assistant

## 2023-04-23 ENCOUNTER — Encounter: Payer: Self-pay | Admitting: Physician Assistant

## 2023-04-23 VITALS — BP 137/76 | HR 81 | Temp 98.4°F | Ht 68.0 in | Wt 215.0 lb

## 2023-04-23 DIAGNOSIS — J0140 Acute pansinusitis, unspecified: Secondary | ICD-10-CM | POA: Insufficient documentation

## 2023-04-23 DIAGNOSIS — Z1231 Encounter for screening mammogram for malignant neoplasm of breast: Secondary | ICD-10-CM | POA: Diagnosis not present

## 2023-04-23 DIAGNOSIS — J302 Other seasonal allergic rhinitis: Secondary | ICD-10-CM | POA: Insufficient documentation

## 2023-04-23 DIAGNOSIS — Z1283 Encounter for screening for malignant neoplasm of skin: Secondary | ICD-10-CM | POA: Diagnosis not present

## 2023-04-23 DIAGNOSIS — Z7689 Persons encountering health services in other specified circumstances: Secondary | ICD-10-CM

## 2023-04-23 MED ORDER — DOXYCYCLINE HYCLATE 100 MG PO TABS: 100.0000 mg | ORAL_TABLET | Freq: Two times a day (BID) | ORAL | 0 refills | Status: AC

## 2023-04-23 MED ORDER — ALBUTEROL SULFATE HFA 108 (90 BASE) MCG/ACT IN AERS: INHALATION_SPRAY | RESPIRATORY_TRACT | 3 refills | Status: AC

## 2023-04-23 MED ORDER — VITAMIN D (ERGOCALCIFEROL) 1.25 MG (50000 UNIT) PO CAPS: 50000.0000 [IU] | ORAL_CAPSULE | ORAL | 2 refills | Status: AC

## 2023-04-23 NOTE — Progress Notes (Signed)
 New Patient Office Visit  Subjective    Patient ID: Tammy Garner, female    DOB: [birth year]  Age: 58 y.o. MRN: [id number]  CC:  Chief Complaint  Patient presents with   New Patient (Initial Visit)    Allergy concerns -needs inhaler refilled  Sinus congestion and ear fullness. On and off x's 1 month     HPI Tammy Garner presents to establish care  Patient presents today with past medical history significant for seasonal allergies and osteoarthritis. She reports increased sinus pain, congestion, ear pressure, and headaches over the last week. Patient takes zyrtec and flonase  daily for symptoms. As well as sudafed as needed and saline sinus rinses. She reports low grade fevers, but denies cough, shortness of breath, or chest pain. She reports itchy and watery eyes associates with seasonal allergies  She requests referral to dermatology today for skin cancer screening.   Outpatient Encounter Medications as of 04/23/2023  Medication Sig   acetaminophen  (TYLENOL ) 500 MG tablet Take 500 mg by mouth every 6 (six) hours as needed for headache.   cetirizine (ZYRTEC) 10 MG tablet Take 10 mg by mouth every evening.   doxycycline  (VIBRA -TABS) 100 MG tablet Take 1 tablet (100 mg total) by mouth 2 (two) times daily for 7 days.   fluticasone  (FLONASE ) 50 MCG/ACT nasal spray Place 1 spray into both nostrils daily as needed for allergies.   fluticasone  (FLONASE ) 50 MCG/ACT nasal spray Place 2 sprays into both nostrils daily.   ibuprofen  (ADVIL ) 200 MG tablet Take 400 mg by mouth every 6 (six) hours as needed (Arthritis).   Multiple Vitamins-Minerals (MULTIVITAMIN ADULTS) TABS Take 1 tablet by mouth daily.   [DISCONTINUED] albuterol  (VENTOLIN  HFA) 108 (90 Base) MCG/ACT inhaler 2 sprays every 4 -6 hours prn wheezing   [DISCONTINUED] benzonatate  (TESSALON ) 100 MG capsule Take 1 capsule (100 mg total) by mouth 3 (three) times daily as needed.   albuterol  (VENTOLIN  HFA) 108 (90 Base)  MCG/ACT inhaler 2 sprays every 4 -6 hours prn wheezing   Vitamin D , Ergocalciferol , (DRISDOL ) 1.25 MG (50000 UNIT) CAPS capsule Take 1 capsule (50,000 Units total) by mouth every 7 (seven) days.   [DISCONTINUED] Vitamin D , Ergocalciferol , (DRISDOL ) 1.25 MG (50000 UNIT) CAPS capsule Take 1 capsule (50,000 Units total) by mouth every 7 (seven) days. (Patient not taking: Reported on 04/23/2023)   No facility-administered encounter medications on file as of 04/23/2023.    Past Medical History:  Diagnosis Date   Allergy    Asthma    Dysmenorrhea 10/04/2013   Environmental allergies    Family history of adverse reaction to anesthesia    mom had facial and neck swelling with TAH 35 yrs ago; pt has had no prob   Fibroid 10/16/2013   Ovarian cyst 10/16/2013    Past Surgical History:  Procedure Laterality Date   CESAREAN SECTION     COLONOSCOPY WITH PROPOFOL  N/A 01/12/2020   Procedure: COLONOSCOPY WITH PROPOFOL ;  Surgeon: Cindie Carlin POUR, DO;  Location: [facility];  Service: Endoscopy;  Laterality: N/A;  1:15-will arrive between 0815-0830   DILATION AND CURETTAGE OF UTERUS     ENDOMETRIAL ABLATION     LAPAROSCOPIC BILATERAL SALPINGECTOMY Bilateral 03/05/2015   Procedure: LAPAROSCOPIC BILATERAL SALPINGECTOMY;  Surgeon: Norleen Edsel GAILS, MD;  Location: [facility];  Service: Gynecology;  Laterality: Bilateral;   LAPAROSCOPIC SUPRACERVICAL HYSTERECTOMY N/A 03/05/2015   Procedure: LAPAROSCOPIC SUPRACERVICAL HYSTERECTOMY;  Surgeon: Norleen Edsel GAILS, MD;  Location: [facility];  Service: Gynecology;  Laterality:  N/A;   NEVUS EXCISION Left 03/05/2015   Procedure: NEVUS EXCISION FROM LEFT ABDOMINAL WALL;  Surgeon: Norleen Edsel GAILS, MD;  Location: [facility];  Service: Gynecology;  Laterality: Left;   TOTAL HIP ARTHROPLASTY Right 06/02/2017   Procedure: RIGHT TOTAL HIP ARTHROPLASTY ANTERIOR APPROACH;  Surgeon: Melodi Lerner, MD;  Location: [facility];  Service: Orthopedics;  Laterality: Right;   TUBAL LIGATION      Family  History  Problem Relation Age of Onset   Hypertension Mother    Heart disease Mother    Cancer Mother        cervical   Hypertension Father    Asthma Sister    Emphysema Maternal Grandfather    Cancer Paternal Grandmother        stomach   Heart attack Paternal Grandfather     Social History   Socioeconomic History   Marital status: Married    Spouse name: Not on file   Number of children: Not on file   Years of education: Not on file   Highest education level: Not on file  Occupational History   Not on file  Tobacco Use   Smoking status: Former    Current packs/day: 0.00    Average packs/day: 0.3 packs/day for 30.0 years (7.5 ttl pk-yrs)    Types: Cigarettes    Start date: 06/03/1987    Quit date: 06/02/2017    Years since quitting: 5.8   Smokeless tobacco: Never  Vaping Use   Vaping status: Former  Substance and Sexual Activity   Alcohol use: Yes    Alcohol/week: 9.0 standard drinks of alcohol    Types: 9 Cans of beer per week   Drug use: No   Sexual activity: Yes    Birth control/protection: Surgical    Comment: hyst  Other Topics Concern   Not on file  Social History Narrative   Not on file   Social Drivers of Health   Financial Resource Strain: Not on file  Food Insecurity: Not on file  Transportation Needs: Not on file  Physical Activity: Not on file  Stress: Not on file  Social Connections: Not on file  Intimate Partner Violence: Not on file    Review of Systems  Constitutional:  Negative for chills, fever and malaise/fatigue.  HENT:  Positive for congestion, ear pain (pressure), sinus pain and sore throat.   Respiratory:  Positive for sputum production. Negative for cough and shortness of breath.   Cardiovascular:  Negative for chest pain and palpitations.  Neurological:  Positive for headaches.        Objective    BP 137/76   Pulse 81   Temp 98.4 F (36.9 C)   Ht 5' 8 (1.727 m)   Wt 215 lb (97.5 kg)   LMP 02/06/2015 (Exact Date)    SpO2 98%   BMI 32.69 kg/m   Physical Exam Vitals reviewed.  Constitutional:      General: She is not in acute distress.    Appearance: Normal appearance.  HENT:     Right Ear: Tympanic membrane normal.     Left Ear: Tympanic membrane normal.     Nose: Congestion and rhinorrhea present.     Right Sinus: Maxillary sinus tenderness and frontal sinus tenderness present.     Left Sinus: Maxillary sinus tenderness and frontal sinus tenderness present.     Mouth/Throat:     Mouth: Mucous membranes are moist.     Pharynx: Oropharynx is clear.  Eyes:  Extraocular Movements: Extraocular movements intact.     Conjunctiva/sclera: Conjunctivae normal.  Cardiovascular:     Rate and Rhythm: Normal rate and regular rhythm.     Heart sounds: Normal heart sounds. No murmur heard. Pulmonary:     Effort: Pulmonary effort is normal.     Breath sounds: Normal breath sounds.  Skin:    General: Skin is warm and dry.  Neurological:     General: No focal deficit present.     Mental Status: She is alert and oriented to person, place, and time.  Psychiatric:        Mood and Affect: Mood normal.        Behavior: Behavior normal.       Assessment & Plan:  Encounter to establish care  Acute non-recurrent pansinusitis Assessment & Plan: Presentation was consistent with sinusitis.  No evidence of other bacterial infections including pneumonia, pharyngitis, otitis media, or orbital cellulitis. Discussed that this fits the picture of viral vs bacterial sinusitis and that due to type and duration of symptoms and exam findings, we will treat as bacterial sinusitis.  Antibiotics prescribed. Advised to continue ibuprofen  and Tylenol  at home. The patient was instructed to return if the worsens in any way, especially if not tolerating fluids, increased sinus pain or swelling, worsening headache, persistent fever, difficulty swallowing or breathing, or as needed. The patient agreed with the plan.     Orders: -     Doxycycline  Hyclate; Take 1 tablet (100 mg total) by mouth 2 (two) times daily for 7 days.  Dispense: 14 tablet; Refill: 0  Seasonal allergies Assessment & Plan: Stable. Advised continued use of Zyrtec or Claritin  and Flonase . May continue with Sudafed as needed. Saline rinses are helpful.  Orders: -     Albuterol  Sulfate HFA; 2 sprays every 4 -6 hours prn wheezing  Dispense: 18 g; Refill: 3  Encounter for screening mammogram for malignant neoplasm of breast -     3D Screening Mammogram, Left and Right  Skin cancer screening -     Ambulatory referral to Dermatology  Other orders -     Vitamin D  (Ergocalciferol ); Take 1 capsule (50,000 Units total) by mouth every 7 (seven) days.  Dispense: 4 capsule; Refill: 2    Return in about 3 months (around 07/23/2023) for phys.   Charmaine Brexlee Heberlein, PA-C

## 2023-04-23 NOTE — Assessment & Plan Note (Signed)
 Presentation was consistent with sinusitis.  No evidence of other bacterial infections including pneumonia, pharyngitis, otitis media, or orbital cellulitis. Discussed that this fits the picture of viral vs bacterial sinusitis and that due to type and duration of symptoms and exam findings, we will treat as bacterial sinusitis.  Antibiotics prescribed. Advised to continue ibuprofen  and Tylenol  at home. The patient was instructed to return if the worsens in any way, especially if not tolerating fluids, increased sinus pain or swelling, worsening headache, persistent fever, difficulty swallowing or breathing, or as needed. The patient agreed with the plan.

## 2023-04-23 NOTE — Assessment & Plan Note (Signed)
 Stable. Advised continued use of Zyrtec or Claritin  and Flonase . May continue with Sudafed as needed. Saline rinses are helpful.

## 2023-06-28 ENCOUNTER — Ambulatory Visit (HOSPITAL_COMMUNITY)
Admission: RE | Admit: 2023-06-28 | Discharge: 2023-06-28 | Disposition: A | Discharge status: Home / Self Care | Source: Ambulatory Visit | Attending: Physician Assistant | Admitting: Physician Assistant

## 2023-06-28 DIAGNOSIS — Z1231 Encounter for screening mammogram for malignant neoplasm of breast: Secondary | ICD-10-CM | POA: Diagnosis present

## 2023-06-30 ENCOUNTER — Ambulatory Visit: Payer: Self-pay | Admitting: Physician Assistant

## 2023-07-23 ENCOUNTER — Encounter: Payer: Self-pay | Admitting: Physician Assistant

## 2023-07-23 ENCOUNTER — Ambulatory Visit: Admitting: Physician Assistant

## 2023-07-23 VITALS — BP 126/85 | HR 67 | Temp 98.4°F | Ht 68.0 in | Wt 216.0 lb

## 2023-07-23 DIAGNOSIS — Z13220 Encounter for screening for lipoid disorders: Secondary | ICD-10-CM | POA: Diagnosis not present

## 2023-07-23 DIAGNOSIS — Z0000 Encounter for general adult medical examination without abnormal findings: Secondary | ICD-10-CM | POA: Diagnosis not present

## 2023-07-23 NOTE — Progress Notes (Signed)
 Complete physical exam  Patient: Tammy Garner   DOB: [birth year]   58 y.o. Female  MRN: [id number]  Subjective:    Chief Complaint  Patient presents with   Annual Exam    Right knee swelling and pain x's 2 weeks. No injury     DEWEY NEUKAM is a 58 y.o. female who presents today for a complete physical exam. She reports consuming a general diet. She is walking and doing chair exercises for activity. She generally feels well. She reports sleeping well. She does have additional problems to discuss today.   She reports right knee pain and swelling. Has history of osteoarthritis. Reports symptoms improve with ibuprofen , heat, and rest. No falls or injuries.   Most recent fall risk assessment:    07/23/2023    9:47 AM  Fall Risk   Falls in the past year? 0  Injury with Fall? 0     Most recent depression screenings:    07/23/2023    9:47 AM 04/23/2023    3:10 PM  PHQ 2/9 Scores  PHQ - 2 Score 2 2  PHQ- 9 Score 4 5    Vision:Within last year and Dental: No current dental problems and Receives regular dental care  Patient Care Team: Stryker Veasey, [place], [place] as PCP - General (Physician Assistant) Cindie Carlin POUR, DO as Consulting Physician (Internal Medicine)   Outpatient Medications Prior to Visit  Medication Sig   acetaminophen  (TYLENOL ) 500 MG tablet Take 500 mg by mouth every 6 (six) hours as needed for headache.   albuterol  (VENTOLIN  HFA) 108 (90 Base) MCG/ACT inhaler 2 sprays every 4 -6 hours prn wheezing   cetirizine (ZYRTEC) 10 MG tablet Take 10 mg by mouth every evening.   fluticasone  (FLONASE ) 50 MCG/ACT nasal spray Place 1 spray into both nostrils daily as needed for allergies.   fluticasone  (FLONASE ) 50 MCG/ACT nasal spray Place 2 sprays into both nostrils daily.   ibuprofen  (ADVIL ) 200 MG tablet Take 400 mg by mouth every 6 (six) hours as needed (Arthritis).   Multiple Vitamins-Minerals (MULTIVITAMIN ADULTS) TABS Take 1 tablet by mouth daily.    Vitamin D , Ergocalciferol , (DRISDOL ) 1.25 MG (50000 UNIT) CAPS capsule Take 1 capsule (50,000 Units total) by mouth every 7 (seven) days.   No facility-administered medications prior to visit.    Review of Systems  Constitutional:  Negative for chills, fever and malaise/fatigue.  Eyes:  Negative for blurred vision and double vision.  Respiratory:  Negative for cough and shortness of breath.   Cardiovascular:  Negative for chest pain and palpitations.  Musculoskeletal:  Positive for joint pain. Negative for myalgias.  Neurological:  Negative for dizziness and headaches.  Psychiatric/Behavioral:  Negative for depression. The patient is not nervous/anxious.           Objective:     BP 126/85   Pulse 67   Temp 98.4 F (36.9 C)   Ht 5' 8 (1.727 m)   Wt 216 lb (98 kg)   LMP 02/06/2015 (Exact Date)   SpO2 97%   BMI 32.84 kg/m   Physical Exam Constitutional:      Appearance: Normal appearance.  HENT:     Head: Normocephalic and atraumatic.     Right Ear: Tympanic membrane normal.     Left Ear: Tympanic membrane normal.     Nose: Nose normal.     Mouth/Throat:     Mouth: Mucous membranes are moist.     Pharynx: Oropharynx is clear.  Eyes:  Extraocular Movements: Extraocular movements intact.     Conjunctiva/sclera: Conjunctivae normal.  Neck:     Thyroid: No thyroid mass, thyromegaly or thyroid tenderness.  Cardiovascular:     Rate and Rhythm: Normal rate and regular rhythm.     Heart sounds: Normal heart sounds. No murmur heard. Pulmonary:     Effort: Pulmonary effort is normal.     Breath sounds: Normal breath sounds. No wheezing or rales.  Abdominal:     General: Abdomen is flat. Bowel sounds are normal.     Palpations: Abdomen is soft.     Tenderness: There is no abdominal tenderness.  Musculoskeletal:     Cervical back: Normal range of motion and neck supple.  Lymphadenopathy:     Cervical: No cervical adenopathy.  Skin:    General: Skin is warm and  dry.  Neurological:     General: No focal deficit present.     Mental Status: She is alert and oriented to person, place, and time.  Psychiatric:        Mood and Affect: Mood normal.        Behavior: Behavior normal.      No results found for any visits on 07/23/23.    Assessment & Plan:    Routine Health Maintenance and Physical Exam  Health Maintenance  Topic Date Due   HIV Screening  Never done   Hepatitis C Screening  Never done   COVID-19 Vaccine (4 - 2024-25 season) 08/08/2023*   Zoster (Shingles) Vaccine (1 of 2) 10/23/2023*   DTaP/Tdap/Td vaccine (1 - Tdap) 07/22/2024*   Hepatitis B Vaccine (1 of 3 - 19+ 3-dose series) 07/22/2024*   Flu Shot  08/06/2023   Pap with HPV screening  09/21/2024   Mammogram  06/27/2025   Colon Cancer Screening  01/11/2030   HPV Vaccine  Aged Out   Meningitis B Vaccine  Aged Out  *Topic was postponed. The date shown is not the original due date.    Discussed health benefits of physical activity, and encouraged her to engage in regular exercise appropriate for her age and condition.  Problem List Items Addressed This Visit   None Visit Diagnoses       Annual visit for general adult medical examination without abnormal findings    -  Primary   Relevant Orders   CMP14+EGFR   CBC with Differential/Platelet     Screening for lipid disorders       Relevant Orders   Lipid panel      Return in about 1 year (around 07/22/2024).  Adult wellness-complete.wellness physical was conducted today. Importance of diet and exercise were discussed in detail.  Importance of stress reduction and healthy living were discussed.  In addition to this a discussion regarding safety was also covered.  We also reviewed over immunizations and gave recommendations regarding current immunization needed for age.   In addition to this additional areas were also touched on including: knee pain- advised continued rest and supportive care. Patient is established  with orthopedics, she was advised to follow up with ortho if symptoms do not improve.  Preventative health exams needed: up to date   Colonoscopy due in 2032  Patient was advised yearly wellness exam     Charmaine Anacarolina Evelyn, PA-C

## 2023-07-26 ENCOUNTER — Ambulatory Visit: Payer: Self-pay | Admitting: Physician Assistant

## 2023-07-27 LAB — CMP14+EGFR
ALT: 20 IU/L (ref 0–32)
AST: 24 IU/L (ref 0–40)
Albumin: 4.4 g/dL (ref 3.8–4.9)
Alkaline Phosphatase: 78 IU/L (ref 44–121)
BUN/Creatinine Ratio: 14 (ref 9–23)
BUN: 9 mg/dL (ref 6–24)
Bilirubin Total: 0.7 mg/dL (ref 0.0–1.2)
CO2: 23 mmol/L (ref 20–29)
Calcium: 9.4 mg/dL (ref 8.7–10.2)
Chloride: 97 mmol/L (ref 96–106)
Creatinine, Ser: 0.66 mg/dL (ref 0.57–1.00)
Globulin, Total: 2.2 g/dL (ref 1.5–4.5)
Glucose: 89 mg/dL (ref 70–99)
Potassium: 4.7 mmol/L (ref 3.5–5.2)
Sodium: 136 mmol/L (ref 134–144)
Total Protein: 6.6 g/dL (ref 6.0–8.5)
eGFR: 103 mL/min/1.73 (ref >59)

## 2023-07-27 LAB — CBC WITH DIFFERENTIAL/PLATELET
Basophils Absolute: 0.1 x10E3/uL (ref 0.0–0.2)
Basos: 1 %
EOS (ABSOLUTE): 0.3 x10E3/uL (ref 0.0–0.4)
Eos: 4 %
Hematocrit: 43.5 % (ref 34.0–46.6)
Hemoglobin: 14.2 g/dL (ref 11.1–15.9)
Immature Grans (Abs): 0 x10E3/uL (ref 0.0–0.1)
Immature Granulocytes: 0 %
Lymphocytes Absolute: 2.9 x10E3/uL (ref 0.7–3.1)
Lymphs: 35 %
MCH: 30.8 pg (ref 26.6–33.0)
MCHC: 32.6 g/dL (ref 31.5–35.7)
MCV: 94 fL (ref 79–97)
Monocytes Absolute: 0.6 x10E3/uL (ref 0.1–0.9)
Monocytes: 8 %
Neutrophils Absolute: 4.3 x10E3/uL (ref 1.4–7.0)
Neutrophils: 52 %
Platelets: 246 x10E3/uL (ref 150–450)
RBC: 4.61 x10E6/uL (ref 3.77–5.28)
RDW: 12.9 % (ref 11.7–15.4)
WBC: 8.2 x10E3/uL (ref 3.4–10.8)

## 2023-07-27 LAB — LIPID PANEL
Chol/HDL Ratio: 2.4 ratio (ref 0.0–4.4)
Cholesterol, Total: 171 mg/dL (ref 100–199)
HDL: 71 mg/dL (ref >39)
LDL Chol Calc (NIH): 81 mg/dL (ref 0–99)
Triglycerides: 104 mg/dL (ref 0–149)
VLDL Cholesterol Cal: 19 mg/dL (ref 5–40)

## 2023-09-02 ENCOUNTER — Ambulatory Visit: Admitting: Dermatology

## 2023-09-02 ENCOUNTER — Encounter: Payer: Self-pay | Admitting: Dermatology

## 2023-09-02 VITALS — BP 140/91 | HR 80

## 2023-09-02 DIAGNOSIS — D485 Neoplasm of uncertain behavior of skin: Secondary | ICD-10-CM | POA: Diagnosis not present

## 2023-09-02 DIAGNOSIS — L821 Other seborrheic keratosis: Secondary | ICD-10-CM

## 2023-09-02 DIAGNOSIS — D225 Melanocytic nevi of trunk: Secondary | ICD-10-CM

## 2023-09-02 DIAGNOSIS — W908XXA Exposure to other nonionizing radiation, initial encounter: Secondary | ICD-10-CM

## 2023-09-02 DIAGNOSIS — L814 Other melanin hyperpigmentation: Secondary | ICD-10-CM

## 2023-09-02 DIAGNOSIS — L578 Other skin changes due to chronic exposure to nonionizing radiation: Secondary | ICD-10-CM

## 2023-09-02 DIAGNOSIS — Z1283 Encounter for screening for malignant neoplasm of skin: Secondary | ICD-10-CM | POA: Diagnosis not present

## 2023-09-02 DIAGNOSIS — D229 Melanocytic nevi, unspecified: Secondary | ICD-10-CM

## 2023-09-02 DIAGNOSIS — D1801 Hemangioma of skin and subcutaneous tissue: Secondary | ICD-10-CM

## 2023-09-02 NOTE — Progress Notes (Signed)
   New Patient Visit   Subjective  Tammy Garner is a 58 y.o. female who presents for the following: Skin Cancer Screening and Full Body Skin Exam  The patient presents for Total-Body Skin Exam (TBSE) for skin cancer screening and mole check. The patient has spots, moles and lesions to be evaluated, some may be new or changing.  The following portions of the chart were reviewed this encounter and updated as appropriate: medications, allergies, medical history  Review of Systems:  No other skin or systemic complaints except as noted in HPI or Assessment and Plan.  Objective  Well appearing patient in no apparent distress; mood and affect are within normal limits.  A full examination was performed including scalp, head, eyes, ears, nose, lips, neck, chest, axillae, abdomen, back, buttocks, bilateral upper extremities, bilateral lower extremities, hands, feet, fingers, toes, fingernails, and toenails. All findings within normal limits unless otherwise noted below.   Relevant physical exam findings are noted in the Assessment and Plan.  Right Buttock 6mm irregularly shaped macule    Assessment & Plan   SKIN CANCER SCREENING PERFORMED TODAY.  ACTINIC DAMAGE - Chronic condition, secondary to cumulative UV/sun exposure - diffuse scaly erythematous macules with underlying dyspigmentation - Recommend daily broad spectrum sunscreen SPF 30+ to sun-exposed areas, reapply every 2 hours as needed.  - Staying in the shade or wearing long sleeves, sun glasses (UVA+UVB protection) and wide brim hats (4-inch brim around the entire circumference of the hat) are also recommended for sun protection.  - Call for new or changing lesions.  LENTIGINES, SEBORRHEIC KERATOSES, HEMANGIOMAS - Benign normal skin lesions - Benign-appearing - Call for any changes  MELANOCYTIC NEVI - Tan-brown and/or pink-flesh-colored symmetric macules and papules - Benign appearing on exam today - Observation - Call  clinic for new or changing moles - Recommend daily use of broad spectrum spf 30+ sunscreen to sun-exposed areas.   NEOPLASM OF UNCERTAIN BEHAVIOR OF SKIN Right Buttock Skin / nail biopsy Type of biopsy: tangential   Informed consent: discussed and consent obtained   Timeout: patient name, date of birth, surgical site, and procedure verified   Procedure prep:  Patient was prepped and draped in usual sterile fashion Prep type:  Isopropyl alcohol Anesthesia: the lesion was anesthetized in a standard fashion   Anesthetic:  1% lidocaine  w/ epinephrine  1-100,000 buffered w/ 8.4% NaHCO3 Instrument used: DermaBlade   Hemostasis achieved with: aluminum chloride   Outcome: patient tolerated procedure well   Post-procedure details: sterile dressing applied and wound care instructions given   Dressing type: petrolatum gauze and bandage    Specimen 1 - Surgical pathology Differential Diagnosis: r/o DN vs MM  Check Margins: No  Return in about 1 year (around 09/01/2024) for TBSC.  I, Berwyn Lesches, Surg Tech III, am acting as scribe for RUFUS CHRISTELLA HOLY, MD.   Documentation: I have reviewed the above documentation for accuracy and completeness, and I agree with the above.  RUFUS CHRISTELLA HOLY, MD

## 2023-09-02 NOTE — Patient Instructions (Signed)

## 2023-09-07 ENCOUNTER — Ambulatory Visit: Payer: Self-pay | Admitting: Dermatology

## 2023-09-07 LAB — SURGICAL PATHOLOGY

## 2024-01-21 LAB — OPHTHALMOLOGY REPORT-SCANNED

## 2024-09-05 ENCOUNTER — Ambulatory Visit: Admitting: Dermatology

## 2024-09-06 ENCOUNTER — Ambulatory Visit: Admitting: Dermatology
